# Patient Record
Sex: Female | Born: 1955 | Race: Black or African American | Hispanic: No | Marital: Single | Smoking: Former smoker
Health system: Southern US, Community
[De-identification: ages and names within clinical notes are randomized; demographics above are authoritative.]

## PROBLEM LIST (undated history)

## (undated) DIAGNOSIS — R2681 Unsteadiness on feet: Secondary | ICD-10-CM

## (undated) DIAGNOSIS — I10 Essential (primary) hypertension: Secondary | ICD-10-CM

## (undated) DIAGNOSIS — Z720 Tobacco use: Secondary | ICD-10-CM

## (undated) DIAGNOSIS — I509 Heart failure, unspecified: Secondary | ICD-10-CM

## (undated) DIAGNOSIS — Z21 Asymptomatic human immunodeficiency virus [HIV] infection status: Secondary | ICD-10-CM

## (undated) DIAGNOSIS — D849 Immunodeficiency, unspecified: Secondary | ICD-10-CM

## (undated) DIAGNOSIS — B20 Human immunodeficiency virus [HIV] disease: Secondary | ICD-10-CM

## (undated) DIAGNOSIS — F259 Schizoaffective disorder, unspecified: Secondary | ICD-10-CM

## (undated) DIAGNOSIS — F329 Major depressive disorder, single episode, unspecified: Secondary | ICD-10-CM

## (undated) DIAGNOSIS — E119 Type 2 diabetes mellitus without complications: Secondary | ICD-10-CM

## (undated) DIAGNOSIS — F32A Depression, unspecified: Secondary | ICD-10-CM

## (undated) DIAGNOSIS — G629 Polyneuropathy, unspecified: Secondary | ICD-10-CM

## (undated) HISTORY — DX: Heart failure, unspecified: I50.9

## (undated) HISTORY — PX: OTHER SURGICAL HISTORY: SHX169

## (undated) HISTORY — PX: HERNIA REPAIR: SHX51

---

## 2016-07-13 ENCOUNTER — Encounter (HOSPITAL_COMMUNITY): Payer: Self-pay | Admitting: Emergency Medicine

## 2016-07-13 ENCOUNTER — Ambulatory Visit (HOSPITAL_COMMUNITY)
Admission: RE | Admit: 2016-07-13 | Discharge: 2016-07-13 | Disposition: A | Discharge status: Home / Self Care | Payer: MEDICAID | Attending: Psychiatry | Admitting: Psychiatry

## 2016-07-13 ENCOUNTER — Emergency Department (HOSPITAL_COMMUNITY)
Admission: EM | Admit: 2016-07-13 | Discharge: 2016-07-31 | Disposition: A | Discharge status: Home / Self Care | Payer: MEDICAID | Attending: Emergency Medicine | Admitting: Emergency Medicine

## 2016-07-13 DIAGNOSIS — F985 Adult onset fluency disorder: Secondary | ICD-10-CM | POA: Diagnosis not present

## 2016-07-13 DIAGNOSIS — R52 Pain, unspecified: Secondary | ICD-10-CM

## 2016-07-13 DIAGNOSIS — Z79899 Other long term (current) drug therapy: Secondary | ICD-10-CM | POA: Diagnosis not present

## 2016-07-13 DIAGNOSIS — Z21 Asymptomatic human immunodeficiency virus [HIV] infection status: Secondary | ICD-10-CM | POA: Insufficient documentation

## 2016-07-13 DIAGNOSIS — F4324 Adjustment disorder with disturbance of conduct: Secondary | ICD-10-CM

## 2016-07-13 DIAGNOSIS — R0602 Shortness of breath: Secondary | ICD-10-CM

## 2016-07-13 DIAGNOSIS — R479 Unspecified speech disturbances: Secondary | ICD-10-CM | POA: Diagnosis present

## 2016-07-13 DIAGNOSIS — F8081 Childhood onset fluency disorder: Secondary | ICD-10-CM

## 2016-07-13 HISTORY — DX: Asymptomatic human immunodeficiency virus (hiv) infection status: Z21

## 2016-07-13 HISTORY — DX: Human immunodeficiency virus (HIV) disease: B20

## 2016-07-13 HISTORY — DX: Essential (primary) hypertension: I10

## 2016-07-13 HISTORY — DX: Immunodeficiency, unspecified: D84.9

## 2016-07-13 HISTORY — DX: Schizoaffective disorder, unspecified: F25.9

## 2016-07-13 HISTORY — DX: Tobacco use: Z72.0

## 2016-07-13 HISTORY — DX: Depression, unspecified: F32.A

## 2016-07-13 HISTORY — DX: Major depressive disorder, single episode, unspecified: F32.9

## 2016-07-13 LAB — COMPREHENSIVE METABOLIC PANEL
ALT: 57 U/L — ABNORMAL HIGH (ref 14–54)
AST: 42 U/L — ABNORMAL HIGH (ref 15–41)
Albumin: 3.9 g/dL (ref 3.5–5.0)
Alkaline Phosphatase: 102 U/L (ref 38–126)
Anion gap: 11 (ref 5–15)
BUN: 37 mg/dL — ABNORMAL HIGH (ref 6–20)
CO2: 21 mmol/L — ABNORMAL LOW (ref 22–32)
Calcium: 9.3 mg/dL (ref 8.9–10.3)
Chloride: 105 mmol/L (ref 101–111)
Creatinine, Ser: 1.94 mg/dL — ABNORMAL HIGH (ref 0.44–1.00)
GFR calc Af Amer: 32 mL/min — ABNORMAL LOW (ref >60)
GFR calc non Af Amer: 28 mL/min — ABNORMAL LOW (ref >60)
Glucose, Bld: 102 mg/dL — ABNORMAL HIGH (ref 65–99)
Potassium: 3.1 mmol/L — ABNORMAL LOW (ref 3.5–5.1)
Sodium: 137 mmol/L (ref 135–145)
Total Bilirubin: 1.4 mg/dL — ABNORMAL HIGH (ref 0.3–1.2)
Total Protein: 8.6 g/dL — ABNORMAL HIGH (ref 6.5–8.1)

## 2016-07-13 LAB — CBC
HCT: 31.1 % — ABNORMAL LOW (ref 36.0–46.0)
Hemoglobin: 10.3 g/dL — ABNORMAL LOW (ref 12.0–15.0)
MCH: 31.3 pg (ref 26.0–34.0)
MCHC: 33.1 g/dL (ref 30.0–36.0)
MCV: 94.5 fL (ref 78.0–100.0)
Platelets: 204 10*3/uL (ref 150–400)
RBC: 3.29 MIL/uL — ABNORMAL LOW (ref 3.87–5.11)
RDW: 17.7 % — ABNORMAL HIGH (ref 11.5–15.5)
WBC: 6.1 10*3/uL (ref 4.0–10.5)

## 2016-07-13 LAB — ETHANOL: Alcohol, Ethyl (B): <5 mg/dL (ref <5)

## 2016-07-13 LAB — ACETAMINOPHEN LEVEL: Acetaminophen (Tylenol), Serum: <10 ug/mL — ABNORMAL LOW (ref 10–30)

## 2016-07-13 LAB — SALICYLATE LEVEL: Salicylate Lvl: <4 mg/dL (ref 2.8–30.0)

## 2016-07-13 MED ORDER — POTASSIUM CHLORIDE CRYS ER 20 MEQ PO TBCR
40.0000 meq | EXTENDED_RELEASE_TABLET | Freq: Once | ORAL | Status: AC
  Administered 2016-07-13: 40 meq via ORAL
  Filled 2016-07-13 (×2): qty 2

## 2016-07-13 MED ORDER — TRAZODONE HCL 50 MG PO TABS
50.0000 mg | ORAL_TABLET | Freq: Every day | ORAL | Status: DC
  Administered 2016-07-13 – 2016-07-20 (×7): 50 mg via ORAL
  Filled 2016-07-13 (×7): qty 1

## 2016-07-13 MED ORDER — HYDROXYZINE HCL 25 MG PO TABS
25.0000 mg | ORAL_TABLET | Freq: Four times a day (QID) | ORAL | Status: DC | PRN
  Administered 2016-07-13 – 2016-07-28 (×16): 25 mg via ORAL
  Filled 2016-07-13 (×18): qty 1

## 2016-07-13 NOTE — BH Assessment (Signed)
Patient has a Agricultural consultant with [facility] [phone].

## 2016-07-13 NOTE — Progress Notes (Signed)
CSW was consulted due to concerns that pt may not be able to return to her group home.    CSW met with patient at bedside after having to ask pt to come back inside due to her leaving to go sit outside and try and find a cigarette.  Patient was extremely difficult to understand due to speech. Patient states that she is not aware of any charges taken against her. Patient states that she feels she is not getting her check. CSW informed her that when living at any type of facility, that it usually requires the check to go toward room and board expenses. After meeting with patient, she asked could she go back outside. CSW informed her that she is not supposed to walk outside because she still receiving care. Patient expressed understanding.   CSW reached out to [place] leader to inquire if any charges or an eviction notice has been taken out on patient. If not, CSW will inform group home that [place] is not an appropriate setting for patient to stay.  CSW reached out to group home x2. However, there was no answer. CSW will continue to follow up.  [place], [place] ED CSW 7/24/20175:28 PM
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
## 2016-07-13 NOTE — ED Notes (Signed)
Patient came out to desk stating that someone was on the phone.  This Rn went into triage room and spoke with Judson Roch from [facility] in [place] office.  She stated that patient was recently released from [facility] on July 17 for PNA to a group home in [place], so patient ACT team was transferred to [facility] office, Lars Masson is team lead and who we can speak to.  Judson Roch also added that they are looking for a new group home for her because the group home in [place] is not taking her back.  Patient also has brain tumor and they believe that is effecting her speech.

## 2016-07-13 NOTE — ED Notes (Signed)
Bed: WTR9 Expected date:  Expected time:  Means of arrival:  Comments: Pt still in room

## 2016-07-13 NOTE — ED Notes (Signed)
Pt refused lab work.  RN aware.  

## 2016-07-13 NOTE — ED Provider Notes (Signed)
[facility] DEPT Provider Note   CSN: [id number] Arrival date & time: 07/13/16  1147  First Provider Contact:  None    By signing my name below, I, Emily Porter, attest that this documentation has been prepared under the direction and in the presence of Virgel Manifold, MD. Electronically Signed: Rayna Porter, ED Scribe. 07/13/16. 3:00 PM.   History   Chief Complaint Chief Complaint  Patient presents with  . Medical Clearance    HPI HPI Comments: Emily Porter is a 60 y.o. female who presents to the Emergency Department for medical clearance. Per nursing note, pt was brought over from [facility] and was told she was "not a candidate" and has no listed medications or medical history. Pt is having difficulty speaking and states that she began taking a new medication which is causing her to stutter. She states she was in a hospital in [place] last week and her medications were changed prior to the onset of her current symptoms. She reports an associated, mild, diffuse, HA and auditory hallucinations and her explanation of the hallucinations is difficult to understand. Pt reports currently being homeless. She denies SI/HI  The history is provided by the patient and medical records. No language interpreter was used.    History reviewed. No pertinent past medical history.  There are no active problems to display for this patient.   Past Surgical History:  Procedure Laterality Date  . HERNIA REPAIR      OB History    No data available       Home Medications    Prior to Admission medications   Not on File    Family History No family history on file.  Social History Social History  Substance Use Topics  . Smoking status: Never Smoker  . Smokeless tobacco: Not on file  . Alcohol use No     Allergies   Penicillins   Review of Systems Review of Systems  Neurological: Positive for headaches.  Psychiatric/Behavioral: Positive for confusion, decreased concentration  and hallucinations. Negative for self-injury and suicidal ideas.  All other systems reviewed and are negative.  Physical Exam Updated Vital Signs BP 113/71 (BP Location: Right Arm)   Pulse 65   Temp 98.3 F (36.8 C) (Oral)   Resp 20   SpO2 99%    Physical Exam  Constitutional: She is oriented to person, place, and time. She appears well-developed.  HENT:  Head: Normocephalic.  Eyes: EOM are normal.  Neck: Normal range of motion.  Pulmonary/Chest: Effort normal.  Abdominal: She exhibits no distension.  Musculoskeletal: Normal range of motion.  Neurological: She is alert and oriented to person, place, and time.  Psychiatric:  Rambling and stuttering speech; very difficult to follow at times; thoughts seemed tangential   Nursing note and vitals reviewed.    ED Treatments / Results  Labs (all labs ordered are listed, but only abnormal results are displayed) Labs Reviewed  COMPREHENSIVE METABOLIC PANEL - Abnormal; Notable for the following:       Result Value   Potassium 3.1 (*)    CO2 21 (*)    Glucose, Bld 102 (*)    BUN 37 (*)    Creatinine, Ser 1.94 (*)    Total Protein 8.6 (*)    AST 42 (*)    ALT 57 (*)    Total Bilirubin 1.4 (*)    GFR calc non Af Amer 28 (*)    GFR calc Af Amer 32 (*)    All other components within  normal limits  ACETAMINOPHEN LEVEL - Abnormal; Notable for the following:    Acetaminophen (Tylenol), Serum <10 (*)    All other components within normal limits  CBC - Abnormal; Notable for the following:    RBC 3.29 (*)    Hemoglobin 10.3 (*)    HCT 31.1 (*)    RDW 17.7 (*)    All other components within normal limits  ETHANOL  SALICYLATE LEVEL  URINE RAPID DRUG SCREEN, HOSP PERFORMED    EKG  EKG Interpretation None       Radiology No results found.  Procedures Procedures  DIAGNOSTIC STUDIES: Oxygen Saturation is 99% on RA, normal by my interpretation.    COORDINATION OF CARE: 2:44 PM Discussed next steps with pt. Pt  verbalized understanding and is agreeable with the plan.    Medications Ordered in ED Medications - No data to display   Initial Impression / Assessment and Plan / ED Course  I have reviewed the triage vital signs and the nursing notes.  Pertinent labs & imaging results that were available during my care of the patient were reviewed by me and considered in my medical decision making (see chart for details).  Clinical Course    59yF with stuttering speech. This is not expressive aphasia. She seems to think this may be a medication side effect. It may be. She is also reporting hearing voices, but it is generally very difficult to get a coherent history. Will medically clear for psych evaluation.  I personally preformed the services scribed in my presence. The recorded information has been reviewed is accurate. Virgel Manifold, MD.   Final Clinical Impressions(s) / ED Diagnoses   Final diagnoses:  Stammering and stuttering    New Prescriptions New Prescriptions   No medications on file     Virgel Manifold, MD 07/13/16 1541

## 2016-07-13 NOTE — ED Notes (Signed)
Patient reports that she was seen by the doctor and he told her that she needed to call a ride and she could leave. Patient states that ride is outside and she is leaving. Patient out in in hallway with blanket stating that she is now leaving. Patient walked out of double doors.
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
## 2016-07-13 NOTE — ED Notes (Signed)
Pt to go to room 31

## 2016-07-13 NOTE — ED Triage Notes (Signed)
Patient brought over from [facility] after they stated that patient "was not a candidate for [facility]". Patient is from [place], [place] with no history or medication. Patient unable to verbalize why she is here. Patient having difficulty talking and expressing thoughts. Did state that her current meds are making her talk "weird". Does not know what meds she is taking.
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
## 2016-07-13 NOTE — Progress Notes (Addendum)
Medicaid [place] access response hx indicates the assigned pcp is [facility] [place], [place] [address] [phone]  Entered in d/c instructions [facility]     Medicaid [place] access response hx indicates the assigned pcp is [facility] [place], [place] [address] [phone]    Next Steps: Schedule an appointment as soon as possible for a visit today    Instructions: This is your assigned Medicaid [place] access doctor If you prefer to see another Medicaid doctor in [facility] other than the one on your Medicaid card [place] [phone] or [phone]

## 2016-07-14 DIAGNOSIS — F4324 Adjustment disorder with disturbance of conduct: Secondary | ICD-10-CM

## 2016-07-14 MED ORDER — AMLODIPINE BESYLATE 10 MG PO TABS
10.0000 mg | ORAL_TABLET | Freq: Every day | ORAL | Status: DC
  Administered 2016-07-14 – 2016-07-21 (×7): 10 mg via ORAL
  Filled 2016-07-14 (×8): qty 1

## 2016-07-14 MED ORDER — SALINE SPRAY 0.65 % NA SOLN
1.0000 | NASAL | Status: DC | PRN
  Administered 2016-07-17 – 2016-07-26 (×7): 1 via NASAL
  Filled 2016-07-14 (×2): qty 44

## 2016-07-14 NOTE — Progress Notes (Signed)
CSW met with patient at bedside per her request. She asked CSW if she has heard from her old group home regarding her check. CS informed her that she has no information regarding her check at this time. CSW also informed pt that she is working to try and find her placement.  Willette Brace [phone] ED CSW 7/25/20175:45 PM
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
## 2016-07-14 NOTE — Consult Note (Signed)
[facility] Face-to-Face Psychiatry Consult   Reason for Consult:  Brought by group home.  Alleging that patient patient was having unprotected sex and patient has HIV  Referring Physician:  EDP Patient Identification: Emily Porter MRN:  [id number] Principal Diagnosis: Adjustment disorder with disturbance of conduct Diagnosis:   Patient Active Problem List   Diagnosis Date Noted  . Adjustment disorder with disturbance of conduct [F43.24] 07/14/2016    Total Time spent with patient: 30 minutes  Subjective:   Emily Porter is a 60 y.o. female patient admitted to [facility] after being brought in by the group home she was residing at.    HPI:  It was in the chart records, that patient was alleged to be engaging in unprotected sex.  Patient has HIV.  It was reported that the facility will not allow her back.  Patient has ACTT Team with [facility] and has a Glass blower/designer.    Today, patient is seen.   She is pleasant.  She does have speech impediment noted, she stutters.  She was a poor historian, noting that she lived with her "sister" then at the "group home", then in "[place]."  She is denying suicidal and homicidal ideations.  Patient is denying psychosis.  She would like to go back to her group home.    Past Psychiatric History:  See HPI  Risk to Self: Suicidal Ideation: No Suicidal Intent: No Is patient at risk for suicide?: No Suicidal Plan?: No Access to Means: No What has been your use of drugs/alcohol within the last 12 months?: na How many times?: 0 Other Self Harm Risks: None Reported Triggers for Past Attempts: Unpredictable Intentional Self Injurious Behavior: None Risk to Others: Homicidal Ideation: No Thoughts of Harm to Others: No Current Homicidal Intent: No Current Homicidal Plan: No Access to Homicidal Means: No Identified Victim: NA History of harm to others?: No Assessment of Violence: None Noted Violent Behavior Description: NA Does patient have access to  weapons?: No Criminal Charges Pending?: No Does patient have a court date: No Prior Inpatient Therapy: Prior Inpatient Therapy: Yes Prior Therapy Dates: 2017 Prior Therapy Facilty/Provider(s): [facility] Reason for Treatment: Manic Prior Outpatient Therapy: Prior Outpatient Therapy: Yes Prior Therapy Dates: Ongoing Prior Therapy Facilty/Provider(s): [facility] Reason for Treatment: ACTT Services Does patient have an ACCT team?: Yes Does patient have Intensive In-House Services?  : No Does patient have [facility] services? : No Does patient have P4CC services?: No  Past Medical History: History reviewed. No pertinent past medical history.  Past Surgical History:  Procedure Laterality Date  . HERNIA REPAIR     Family History: No family history on file. Family Psychiatric  History:  See HPI Social History:  History  Alcohol Use No     History  Drug use: Unknown    Social History   Social History  . Marital status: Single    Spouse name: N/A  . Number of children: N/A  . Years of education: N/A   Social History Main Topics  . Smoking status: Never Smoker  . Smokeless tobacco: None  . Alcohol use No  . Drug use: Unknown  . Sexual activity: Not Asked   Other Topics Concern  . None   Social History Narrative  . None   Additional Social History:    Allergies:   Allergies  Allergen Reactions  . Penicillins Hives    Has patient had a PCN reaction causing immediate rash, facial/tongue/throat swelling, SOB or lightheadedness with hypotension: unknown Has  patient had a PCN reaction causing severe rash involving mucus membranes or skin necrosis: unknown Has patient had a PCN reaction that required hospitalization : unknown Has patient had a PCN reaction occurring within the last 10 years: no  If all of the above answers are "NO", then may proceed with Cephalosporin use.     Labs:  Results for orders placed or performed during the hospital encounter of  07/13/16 (from the past 48 hour(s))  Comprehensive metabolic panel     Status: Abnormal   Collection Time: 07/13/16  1:24 PM  Result Value Ref Range   Sodium 137 135 - 145 mmol/L   Potassium 3.1 (L) 3.5 - 5.1 mmol/L   Chloride 105 101 - 111 mmol/L   CO2 21 (L) 22 - 32 mmol/L   Glucose, Bld 102 (H) 65 - 99 mg/dL   BUN 37 (H) 6 - 20 mg/dL   Creatinine, Ser 1.94 (H) 0.44 - 1.00 mg/dL   Calcium 9.3 8.9 - 10.3 mg/dL   Total Protein 8.6 (H) 6.5 - 8.1 g/dL   Albumin 3.9 3.5 - 5.0 g/dL   AST 42 (H) 15 - 41 U/L   ALT 57 (H) 14 - 54 U/L   Alkaline Phosphatase 102 38 - 126 U/L   Total Bilirubin 1.4 (H) 0.3 - 1.2 mg/dL   GFR calc non Af Amer 28 (L) >60 mL/min   GFR calc Af Amer 32 (L) >60 mL/min    Comment: (NOTE) The eGFR has been calculated using the CKD EPI equation. This calculation has not been validated in all clinical situations. eGFR's persistently <60 mL/min signify possible Chronic Kidney Disease.    Anion gap 11 5 - 15  Ethanol     Status: None   Collection Time: 07/13/16  1:24 PM  Result Value Ref Range   Alcohol, Ethyl (B) <5 <5 mg/dL    Comment:        LOWEST DETECTABLE LIMIT FOR SERUM ALCOHOL IS 5 mg/dL FOR MEDICAL PURPOSES ONLY   Salicylate level     Status: None   Collection Time: 07/13/16  1:24 PM  Result Value Ref Range   Salicylate Lvl <0.2 2.8 - 30.0 mg/dL  Acetaminophen level     Status: Abnormal   Collection Time: 07/13/16  1:24 PM  Result Value Ref Range   Acetaminophen (Tylenol), Serum <10 (L) 10 - 30 ug/mL    Comment:        THERAPEUTIC CONCENTRATIONS VARY SIGNIFICANTLY. A RANGE OF 10-30 ug/mL MAY BE AN EFFECTIVE CONCENTRATION FOR MANY PATIENTS. HOWEVER, SOME ARE BEST TREATED AT CONCENTRATIONS OUTSIDE THIS RANGE. ACETAMINOPHEN CONCENTRATIONS >150 ug/mL AT 4 HOURS AFTER INGESTION AND >50 ug/mL AT 12 HOURS AFTER INGESTION ARE OFTEN ASSOCIATED WITH TOXIC REACTIONS.   cbc     Status: Abnormal   Collection Time: 07/13/16  1:24 PM  Result Value Ref  Range   WBC 6.1 4.0 - 10.5 K/uL   RBC 3.29 (L) 3.87 - 5.11 MIL/uL   Hemoglobin 10.3 (L) 12.0 - 15.0 g/dL   HCT 31.1 (L) 36.0 - 46.0 %   MCV 94.5 78.0 - 100.0 fL   MCH 31.3 26.0 - 34.0 pg   MCHC 33.1 30.0 - 36.0 g/dL   RDW 17.7 (H) 11.5 - 15.5 %   Platelets 204 150 - 400 K/uL    Current Facility-Administered Medications  Medication Dose Route Frequency Provider Last Rate Last Dose  . hydrOXYzine (ATARAX/VISTARIL) tablet 25 mg  25 mg Oral Q6H PRN Maurine Minister  Simon, PA-C   25 mg at 07/13/16 2346  . sodium chloride (OCEAN) 0.65 % nasal spray 1 spray  1 spray Each Nare PRN Harvel Quale, MD      . traZODone (DESYREL) tablet 50 mg  50 mg Oral QHS Laverle Hobby, PA-C   50 mg at 07/13/16 2346   No current outpatient prescriptions on file.    Musculoskeletal: Strength & Muscle Tone: within normal limits Gait & Station: normal Patient leans: N/A  Psychiatric Specialty Exam: Physical Exam  ROS  Blood pressure 132/72, pulse 82, temperature 98.8 F (37.1 C), temperature source Oral, resp. rate 20, SpO2 98 %.There is no height or weight on file to calculate BMI.  General Appearance: Disheveled  Eye Contact:  Fair  Speech:  stuttered speech  Volume:  Normal  Mood:  Anxious  Affect:  Congruent  Thought Process:  Coherent  Orientation:  Full (Time, Place, and Person)  Thought Content:  Rumination  Suicidal Thoughts:  No  Homicidal Thoughts:  No  Memory:  Immediate;   Good Recent;   Good Remote;   Good  Judgement:  Fair  Insight:  Fair  Psychomotor Activity:  Normal  Concentration:  Concentration: Fair and Attention Span: Fair  Recall:  [employer] of Knowledge:  Fair  Language:  Fair  Akathisia:  Negative  Handed:  Right  AIMS (if indicated):     Assets:  Resilience  ADL's:  Intact  Cognition:  WNL  Sleep:  poor   Treatment Plan Summary: Daily contact with patient to assess and evaluate symptoms and progress in treatment, Medication management and Plan Discharge back to  group Home  Disposition: No evidence of imminent risk to self or others at present.   Patient does not meet criteria for psychiatric inpatient admission. Supportive therapy provided about ongoing stressors. Discussed crisis plan, support from social network, calling 911, coming to the Emergency Department, and calling Suicide Hotline.  Janett Labella, NP [facility] 07/14/2016 12:08 PM   Patient seen face-to-face for psychiatric evaluation, chart reviewed and case discussed with the physician extender and developed treatment plan. Reviewed the information documented and agree with the treatment plan. Corena Pilgrim, MD

## 2016-07-14 NOTE — Progress Notes (Signed)
CSW received call from [facility] stating that she has an open bed.  She states that she will be able to come to assesst he patient to determine if she is a good fit for her facility. She states that she will be to [facility] tomorrow around 7:00pm.  Willette Brace [phone] ED CSW 7/25/20175:38 PM

## 2016-07-15 MED ORDER — VITAMIN B-6 50 MG PO TABS
50.0000 mg | ORAL_TABLET | Freq: Every day | ORAL | Status: DC
  Administered 2016-07-15 – 2016-07-21 (×7): 50 mg via ORAL
  Filled 2016-07-15 (×7): qty 1

## 2016-07-15 MED ORDER — HALOPERIDOL 5 MG PO TABS
5.0000 mg | ORAL_TABLET | Freq: Every day | ORAL | Status: DC
  Administered 2016-07-15 – 2016-07-21 (×7): 5 mg via ORAL
  Filled 2016-07-15 (×7): qty 1

## 2016-07-15 MED ORDER — LISINOPRIL 40 MG PO TABS
40.0000 mg | ORAL_TABLET | Freq: Every day | ORAL | Status: DC
  Administered 2016-07-15 – 2016-07-21 (×6): 40 mg via ORAL
  Filled 2016-07-15 (×8): qty 1

## 2016-07-15 MED ORDER — SENNA 8.6 MG PO TABS
1.0000 | ORAL_TABLET | Freq: Two times a day (BID) | ORAL | Status: DC
  Administered 2016-07-15 – 2016-07-21 (×12): 8.6 mg via ORAL
  Filled 2016-07-15 (×12): qty 1

## 2016-07-15 MED ORDER — NICOTINE 14 MG/24HR TD PT24
14.0000 mg | MEDICATED_PATCH | Freq: Every day | TRANSDERMAL | Status: DC
  Administered 2016-07-17 – 2016-07-30 (×7): 14 mg via TRANSDERMAL
  Filled 2016-07-15 (×11): qty 1

## 2016-07-15 MED ORDER — ATAZANAVIR SULFATE 200 MG PO CAPS
400.0000 mg | ORAL_CAPSULE | Freq: Every day | ORAL | Status: DC
  Administered 2016-07-16 – 2016-07-20 (×5): 400 mg via ORAL
  Filled 2016-07-15 (×8): qty 2

## 2016-07-15 MED ORDER — ISONIAZID 300 MG PO TABS
300.0000 mg | ORAL_TABLET | Freq: Every day | ORAL | Status: DC
  Administered 2016-07-15 – 2016-07-21 (×7): 300 mg via ORAL
  Filled 2016-07-15 (×7): qty 1

## 2016-07-15 MED ORDER — BUPROPION HCL ER (SR) 150 MG PO TB12
150.0000 mg | ORAL_TABLET | Freq: Two times a day (BID) | ORAL | Status: DC
  Administered 2016-07-15 – 2016-07-21 (×12): 150 mg via ORAL
  Filled 2016-07-15 (×13): qty 1

## 2016-07-15 MED ORDER — ABACAVIR SULFATE 300 MG PO TABS
600.0000 mg | ORAL_TABLET | Freq: Every day | ORAL | Status: DC
  Administered 2016-07-15 – 2016-07-21 (×7): 600 mg via ORAL
  Filled 2016-07-15 (×7): qty 2

## 2016-07-15 MED ORDER — CHOLECALCIFEROL 10 MCG (400 UNIT) PO TABS
800.0000 [IU] | ORAL_TABLET | Freq: Every day | ORAL | Status: DC
Start: 2016-07-15 — End: 2016-07-21
  Administered 2016-07-15 – 2016-07-21 (×7): 800 [IU] via ORAL
  Filled 2016-07-15 (×7): qty 2

## 2016-07-15 MED ORDER — HYDROCHLOROTHIAZIDE 25 MG PO TABS
25.0000 mg | ORAL_TABLET | Freq: Every day | ORAL | Status: DC
Start: 2016-07-15 — End: 2016-07-21
  Administered 2016-07-15 – 2016-07-21 (×6): 25 mg via ORAL
  Filled 2016-07-15 (×7): qty 1

## 2016-07-15 MED ORDER — ALBUTEROL SULFATE HFA 108 (90 BASE) MCG/ACT IN AERS
2.0000 | INHALATION_SPRAY | Freq: Four times a day (QID) | RESPIRATORY_TRACT | Status: DC | PRN
  Administered 2016-07-15 – 2016-07-30 (×15): 2 via RESPIRATORY_TRACT
  Filled 2016-07-15 (×8): qty 6.7

## 2016-07-15 MED ORDER — GABAPENTIN 100 MG PO CAPS
100.0000 mg | ORAL_CAPSULE | Freq: Three times a day (TID) | ORAL | Status: DC
  Administered 2016-07-15 – 2016-07-21 (×17): 100 mg via ORAL
  Filled 2016-07-15 (×17): qty 1

## 2016-07-15 MED ORDER — AMANTADINE HCL 100 MG PO CAPS
100.0000 mg | ORAL_CAPSULE | Freq: Two times a day (BID) | ORAL | Status: DC
  Administered 2016-07-15 – 2016-07-21 (×12): 100 mg via ORAL
  Filled 2016-07-15 (×12): qty 1

## 2016-07-15 MED ORDER — NICOTINE POLACRILEX 2 MG MT GUM
4.0000 mg | CHEWING_GUM | OROMUCOSAL | Status: DC | PRN
  Administered 2016-07-24 – 2016-07-30 (×3): 4 mg via ORAL
  Filled 2016-07-15 (×3): qty 2

## 2016-07-15 MED ORDER — LAMIVUDINE 150 MG PO TABS
300.0000 mg | ORAL_TABLET | Freq: Every day | ORAL | Status: DC
  Administered 2016-07-15 – 2016-07-16 (×2): 300 mg via ORAL
  Filled 2016-07-15 (×2): qty 2

## 2016-07-15 NOTE — Progress Notes (Addendum)
CSW spoke with Colletta Maryland with the [facility]. She stated their Housing Specialist is looking for beds for placement and that [employer] has some vacancies and they were informed to call back in the morning.   CSW spoke with Cleo at [facility] who states she spoke with Spero Geralds. She stated Mrs. Torain stated stated she was informed she had to take charges against patient. CSW informed her that she was asked if she had taken charges or if she had completed an eviction notice. She stated they have only had patient since July 17th and she is new to their agency.   CSW spoke patient who asked if CSW could place her in an apartment. Patient was informed she would not be placed in an apartment but that placement was being worked on at the time.    Colletta Maryland, [facility] [phone]

## 2016-07-15 NOTE — Progress Notes (Signed)
CSW continues to search for placement for patient. CSW reached out to multiple facilities, the following answered their phone are as listed:  [facility] : No open [place]: No open [place]: No open [place]: No open [facility]  [facility]: No open  Beds  [facility]:   CSW spoke with staff member Ivin Booty, who states that there may be an opening. She states that she will have the owner/operator Elwanda Brooklyn reach back out to CSW I order to possibly schedule an assessment for tomorrow.  CSW will continue to follow up.  Willette Brace [id number] ED CSW 7/26/20178:34 PM

## 2016-07-15 NOTE — Progress Notes (Addendum)
Staffed case with Asst. Social Work Mudlogger.   Called and spoke with Spero Geralds, Owner "[facility]". She stated on yesterday she was going to take charges against the patient. She stated she had the paperwork for the charges. CSW asked if she could fax the information and she stated she would fax. CSW provided fax number to send information [phone]. She stated she would send the information this evening.  Spoke with Berenice Bouton with [facility]. She stated information could be faxed to [phone] for review to see if patient would be appropriate for the home.   Call received from Berenice Bouton who states she received the information and she will review it with her supervisor.   Genice Rouge [id number] ED CSW 07/15/2016 11:12 AM

## 2016-07-15 NOTE — ED Notes (Signed)
Patient just finished showering.

## 2016-07-15 NOTE — ED Notes (Signed)
Patient is impulsive, intrusive, and excessively loud.  Patient is cooperative does respond to redirection.

## 2016-07-15 NOTE — Progress Notes (Signed)
07/15/16  1700  Notified MD that patient home medicines have not be started. Per MD he will look over medicines.

## 2016-07-15 NOTE — ED Notes (Signed)
Patient refused Vital signs. 

## 2016-07-15 NOTE — ED Provider Notes (Signed)
Reevaluation- since arrival, the patient has been evaluated, by psychiatry who felt that she did not have any acute psychiatric abnormalities. Additionally, information from her prior medical providers has been obtained including a medication list. As of now. She is not yet been started on her home medicines other than Norvasc, which was apparently yesterday. I will order her routine medications now. I will consult case management to see if they are proceeding with placement.   Daleen Bo, MD 07/15/16 1721

## 2016-07-15 NOTE — Progress Notes (Signed)
07/15/16  1856  Called [facility] for an albuterol treatment per patient request.  Per [facility] they will be here shortly. Pt has order for albuterol inhaler.

## 2016-07-16 MED ORDER — LAMIVUDINE 150 MG PO TABS
150.0000 mg | ORAL_TABLET | Freq: Every day | ORAL | Status: DC
  Administered 2016-07-17 – 2016-07-20 (×4): 150 mg via ORAL
  Filled 2016-07-16 (×4): qty 1

## 2016-07-16 NOTE — ED Notes (Signed)
Patient had incontinent episode.  New linen placed on bed.  Patient refused to shower.

## 2016-07-16 NOTE — Progress Notes (Addendum)
Patient's case staffed in LOS this AM. CSW contacted [place], [place], [place] and spoke with [facility], Admissions [phone]. She stated they no longer have AFL but they are now only long tern care and rehab. Director of Social Work updated.   CSW attempted a call to [employer] at [facility]. She was not in at the time. CSW will follow-up. CSW attempted another call to [place] and was informed she was not in at the time.   CSW received approval from Director of Social Work to send patient's information to ALF for placement. Patient's information was sent to ALF to assist with placement, two declines no acceptances at this time 3:44pm.  Genice Rouge [id number] ED CSW 07/16/2016 12:26 PM

## 2016-07-16 NOTE — ED Triage Notes (Signed)
Pt talking to someone/something in room

## 2016-07-16 NOTE — NC FL2 (Cosign Needed)
[place] MEDICAID FL2 LEVEL OF CARE SCREENING TOOL     IDENTIFICATION  Patient Name: Emily Porter Birthdate: 06-Dec-1956 Sex: female Admission Date (Current Location): 07/13/2016  [facility] and [place] Number:  Herbalist and Address:  [facility],  [place] [address], [place]      Provider Number: [id number]  Attending Physician Name and Address:  No att. providers found  Relative Name and Phone Number:       Current Level of Care: Hospital Recommended Level of Care: [place] Prior Approval Number:    Date Approved/Denied:   PASRR Number:    Discharge Plan: Other (Comment)    Current Diagnoses: Patient Active Problem List   Diagnosis Date Noted  . 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    Orientation RESPIRATION BLADDER Height & Weight     Self, Situation, Place  Normal Incontinent Weight: 205 lb (93 kg) (Patient refused to stand on the scale for weight) Height:  5\' 9"  (175.3 cm)  BEHAVIORAL SYMPTOMS/MOOD NEUROLOGICAL BOWEL NUTRITION STATUS      Continent Diet  AMBULATORY STATUS COMMUNICATION OF NEEDS Skin   Independent Verbally Normal                       Personal Care Assistance Level of Assistance  Bathing, Feeding, Dressing Bathing: Per Nurse, needs assistance, cues and commands, does not follow through with what she states she will do Feeding assistance: Independent Dressing: Per Nurse, patient will needs cues and commands to assist with dressing     Functional Limitations Info  Sight, Hearing, Speech Sight Info: Adequate Hearing Info: Adequate Speech Info:  (Patient stutters, have to listen carefully per Nurse)    [place]  Blood pressure Blood Pressure Frequency: Per Nurse, patient on blood pressure medication                  Contractures      Additional Factors Info  Code Status, Allergies Code Status Info:   (Full Code) Allergies Info:  (Penicillins)           Current Medications (07/16/2016):  This is the current hospital active medication list Current Facility-Administered Medications  Medication Dose Route Frequency Provider Last Rate Last Dose  . abacavir (ZIAGEN) tablet 600 mg  600 mg Oral Daily Daleen Bo, MD   600 mg at 07/16/16 1050  . albuterol (PROVENTIL HFA;VENTOLIN HFA) 108 (90 Base) MCG/ACT inhaler 2 puff  2 puff Inhalation Q6H PRN Daleen Bo, MD   2 puff at 07/15/16 1910  . amantadine (SYMMETREL) capsule 100 mg  100 mg Oral BID Daleen Bo, MD   100 mg at 07/16/16 1051  . amLODipine (NORVASC) tablet 10 mg  10 mg Oral Daily Harvel Quale, MD   10 mg at 07/16/16 1049  . atazanavir (REYATAZ) capsule 400 mg  400 mg Oral Q breakfast Daleen Bo, MD      . buPROPion [facility] SR) 12 hr tablet 150 mg  150 mg Oral BID Daleen Bo, MD   150 mg at 07/16/16 1050  . cholecalciferol (VITAMIN D) tablet 800 Units  800 Units Oral Daily Daleen Bo, MD   800 Units at 07/16/16 1050  . gabapentin (NEURONTIN) capsule 100 mg  100 mg Oral TID Daleen Bo, MD   100 mg at 07/16/16 1050  . haloperidol (HALDOL) tablet 5 mg  5 mg Oral Daily Daleen Bo, MD  5 mg at 07/16/16 1051  . hydrochlorothiazide (HYDRODIURIL) tablet 25 mg  25 mg Oral Daily Daleen Bo, MD   25 mg at 07/16/16 1051  . hydrOXYzine (ATARAX/VISTARIL) tablet 25 mg  25 mg Oral Q6H PRN Laverle Hobby, PA-C   25 mg at 07/15/16 2000  . isoniazid (NYDRAZID) tablet 300 mg  300 mg Oral Daily Daleen Bo, MD   300 mg at 07/16/16 1050  . lamiVUDine (EPIVIR) tablet 300 mg  300 mg Oral Daily Daleen Bo, MD   300 mg at 07/16/16 1049  . lisinopril (PRINIVIL,ZESTRIL) tablet 40 mg  40 mg Oral Daily Daleen Bo, MD   40 mg at 07/16/16 1049  . nicotine (NICODERM CQ - dosed in mg/24 hours) patch 14 mg  14 mg Transdermal Daily Daleen Bo, MD      . nicotine polacrilex (NICORETTE) gum 4 mg  4 mg Oral Q2H PRN Daleen Bo, MD       . pyridOXINE (VITAMIN B-6) tablet 50 mg  50 mg Oral Daily Daleen Bo, MD   50 mg at 07/16/16 1051  . senna (SENOKOT) tablet 8.6 mg  1 tablet Oral BID Daleen Bo, MD   8.6 mg at 07/16/16 1051  . sodium chloride (OCEAN) 0.65 % nasal spray 1 spray  1 spray Each Nare PRN Harvel Quale, MD      . traZODone (DESYREL) tablet 50 mg  50 mg Oral QHS Laverle Hobby, PA-C   50 mg at 07/15/16 2259   Current Outpatient Prescriptions  Medication Sig Dispense Refill  . abacavir (ZIAGEN) 300 MG tablet Take 600 mg by mouth daily.    Marland Kitchen albuterol (PROVENTIL HFA;VENTOLIN HFA) 108 (90 Base) MCG/ACT inhaler Inhale 2 puffs into the lungs every 6 (six) hours as needed for wheezing or shortness of breath.    Marland Kitchen amantadine (SYMMETREL) 100 MG capsule Take 100 mg by mouth 2 (two) times daily.    Marland Kitchen amLODipine (NORVASC) 10 MG tablet Take 10 mg by mouth daily.    Marland Kitchen atazanavir (REYATAZ) 200 MG capsule Take 400 mg by mouth daily with breakfast.    . buPROPion (WELLBUTRIN SR) 150 MG 12 hr tablet Take 150 mg by mouth 2 (two) times daily.    . cholecalciferol (VITAMIN D) 400 units TABS tablet Take 800 Units by mouth daily.    Marland Kitchen gabapentin (NEURONTIN) 100 MG capsule Take 100 mg by mouth 3 (three) times daily.    . haloperidol (HALDOL) 5 MG tablet Take 5 mg by mouth daily.    . hydrochlorothiazide (HYDRODIURIL) 25 MG tablet Take 25 mg by mouth daily.    Marland Kitchen isoniazid (NYDRAZID) 300 MG tablet Take 300 mg by mouth daily.    Marland Kitchen lamivudine (EPIVIR) 300 MG tablet Take 300 mg by mouth daily.    Marland Kitchen lisinopril (PRINIVIL,ZESTRIL) 40 MG tablet Take 40 mg by mouth daily.    . nicotine (NICODERM CQ - DOSED IN MG/24 HOURS) 14 mg/24hr patch Place 14 mg onto the skin daily.    . nicotine polacrilex (NICORETTE) 4 MG gum Take 4 mg by mouth every 2 (two) hours as needed for smoking cessation.    . senna (SENOKOT) 8.6 MG TABS tablet Take 1 tablet by mouth 2 (two) times daily.    . Thiamine HCl (VITAMIN B-1 PO) Take 0.5 tablets by mouth  daily.       Discharge Medications: Please see discharge summary for a list of discharge medications.  Relevant Imaging Results:  Relevant Lab Results:  Additional Information    Guadelupe Sabin, LCSW

## 2016-07-16 NOTE — ED Notes (Signed)
Emily Porter refused vital signs

## 2016-07-16 NOTE — ED Triage Notes (Signed)
Pt requested sandwich and drink, denies discomfort, watching TV in room, continue to monitor status

## 2016-07-17 MED ORDER — LORAZEPAM 1 MG PO TABS
2.0000 mg | ORAL_TABLET | Freq: Once | ORAL | Status: AC
  Administered 2016-07-17: 2 mg via ORAL
  Filled 2016-07-17: qty 2

## 2016-07-17 NOTE — Progress Notes (Signed)
Mali, RN came back to talk to patient.

## 2016-07-17 NOTE — ED Notes (Signed)
Patient given sandwich, drink(soda) and cheese

## 2016-07-17 NOTE — ED Notes (Signed)
Pt lying in bed with teddy bear. Pt state's the teddy bear's name is Johntay and sometimes he talks to me.

## 2016-07-17 NOTE — ED Notes (Signed)
Pt given sandwich and soda. Explained to pt that she would not be able to get another sandwich or soda during the night. Pt verbalized understanding.

## 2016-07-17 NOTE — Progress Notes (Signed)
Pt made last phone call for today. Informed patient of rules in TCU.

## 2016-07-17 NOTE — ED Notes (Signed)
Pt has become increasingly agitated. Ativan 2mg  PO given.

## 2016-07-17 NOTE — ED Notes (Signed)
Pt came out room asking if she could use the phone to call her social worker. Explained to pt that no phone calls were allowed after 9pm and that she would be able to make a phone call at [place] verbalized understanding and returned her bed.

## 2016-07-17 NOTE — ED Notes (Addendum)
Pt asking for albuterol inhaler and saline nasal spray, stating her "sinuses are stopped up". Albuterol inhaler administered. Pt frequently asking for soda, sandwiches and crackers. Informed pt that she can have water at this time and that she was given crackers and a sandwich at previously before bed and would not receive an additional sandwich at this time.

## 2016-07-17 NOTE — ED Notes (Addendum)
Pt walks out of the door asking for a soda. Informed pt that soda was not given after 9pm, but she could have water. Pt becomes agitated and states "I haven't had anything", walks back into the room and closes the door. Upon going in the pt's room wet spots noted on the floor. Pt was then asked if she had urinated on the floor, and pt states she spilled something on the floor. Pt then states she did urinate and scrubs and bed linen were noted to be wet as well. Pt assisted with set up for a bath. Linen changed and new scrubs provided. Pt given prn vistaril to decrease agitation.

## 2016-07-17 NOTE — Progress Notes (Signed)
Pt refuses to stay in room and keep door open. Yelling into hallway. Frequently calling for staff outside of door.

## 2016-07-17 NOTE — Progress Notes (Addendum)
CSW spoke with [employer], Admissions at [facility]. She stated this patient had been to their sister facility and patient would not be allowed admission at [facility]. Asst. Director of Social Work updated.   CSW staffed case with Asst. Director of Social Work. Patient's case will be sent to group homes through the hub for placement. Information sent pending request.  Call received from [facility] with [place] [phone]. She stated she reviewed patient's information and she will not be able to accept patient at her group home.   Genice Rouge [id number] ED CSW 07/17/2016 12:13 PM

## 2016-07-17 NOTE — ED Notes (Signed)
Pt excessively loud asking for the date and day of the week. Notified pt that it was Friday the 28th. Upon leaving pt's room pt states she missed a holiday and wishes the nurse a "happy mother's day".

## 2016-07-17 NOTE — ED Notes (Signed)
Pt walked to the nurse's station asking if she could shower. Informed pt she would not be able to shower at this time and that she bathed approximately an hour prior to pt asking to bathe again. Explained to pt that she would be able to take a shower later in the morning if needed.
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
## 2016-07-18 NOTE — ED Notes (Signed)
Patient given snack of sandwich and diet non caffeine Coke.

## 2016-07-18 NOTE — Progress Notes (Signed)
Patient requesting to speak to social worker and demanding more food redirection ineffective.Security notified. Patient advised this would be her last snack until dinner. Social worker informed of patient's request to speak with her.

## 2016-07-18 NOTE — ED Notes (Signed)
Patient requesting that I order her another tray, informed patient that I could not I gave patient a sandwich. Patient remains unsatisfied yelling and cursing.

## 2016-07-18 NOTE — ED Notes (Signed)
Patient at desk asking to use phone but having difficulty dialing even with assistance from nurse.  Talking in a loud voice and about things which are difficult to understand.

## 2016-07-18 NOTE — ED Notes (Signed)
Pt yelling "Nurse,nurse, can I see you for a minute?" When the the nurse went into the pt's room, pt asked "can you do me a favor, can you bring me a wig tomorrow?" Notified the pt that nursing staff would not be able to bring a wig tomorrow.

## 2016-07-18 NOTE — ED Notes (Signed)
Patient up in room yelling stating she needs help dressing and changing her bed.  Patient is up in the room walking around and bathing herself in the room.

## 2016-07-18 NOTE — ED Notes (Signed)
Pt frequently yelling out to nurse at the nurse's station asking for soda, stating she has been good all day and that she was hungry.  Notified pt that she could not have a soda at this time but she could have a cup of water and graham crackers until breakfast was served later in the morning. Pt provided with water and graham crackers. Pt cooperative at this time.

## 2016-07-18 NOTE — ED Notes (Signed)
Social  worker in with patient

## 2016-07-18 NOTE — ED Notes (Signed)
Patient redirected to stay in room and not wander up to desk asking to use the phone.  Patient playing with her teddy bear covering it up and murmuring to it.

## 2016-07-18 NOTE — ED Notes (Signed)
Patient sleeping, breathing even and unlabored, NAD at this time, will continue to monitor

## 2016-07-18 NOTE — ED Notes (Signed)
Patient breathing even and unlabored, NAD at this time, will continue to monitor patient.

## 2016-07-18 NOTE — ED Notes (Signed)
Patient sleeping, breathing even and unlabored, NAD at this time, will continue to monitor.

## 2016-07-18 NOTE — ED Notes (Signed)
Patient resting at this time will check vital signs when patient wakes up.

## 2016-07-18 NOTE — ED Notes (Signed)
Patient extremely agitated cursing walking around unit advised patient to return to room patient yelling derogatory  comments advised I was calling security patient returned to room.

## 2016-07-19 ENCOUNTER — Emergency Department (HOSPITAL_COMMUNITY): Payer: MEDICAID

## 2016-07-19 MED ORDER — GUAIFENESIN ER 600 MG PO TB12
1200.0000 mg | ORAL_TABLET | Freq: Two times a day (BID) | ORAL | Status: DC
  Administered 2016-07-19 – 2016-07-21 (×4): 1200 mg via ORAL
  Filled 2016-07-19 (×5): qty 2

## 2016-07-19 NOTE — ED Notes (Signed)
Pt refused Vital signs .

## 2016-07-19 NOTE — ED Notes (Signed)
Pt will not let me get her vitals

## 2016-07-19 NOTE — ED Notes (Signed)
Received notification from secretary that patient has been Sports administrator numerus times asking them to connect her to various phone numbers. Will speak to patient regarding phone privileges.

## 2016-07-19 NOTE — ED Notes (Signed)
Patient refusing to have vital signs taken at this time.

## 2016-07-19 NOTE — ED Notes (Signed)
Bed: [id number] Expected date:  Expected time:  Means of arrival:  Comments: Hold rm 31

## 2016-07-19 NOTE — ED Notes (Addendum)
Pt making exaggerated gasping noises and yelling at this rn to get her an inhaler, now. Pt informed that the writer does not like getting yelled at.

## 2016-07-19 NOTE — ED Notes (Addendum)
Pt permitted the writer to take her vital signs. She requested something to eat . Pt stated, "I am starved all the time. " Food and drink given to the pt. Instructed pt and reinforced the importance of pt voiding in the BR and keeping her room clean.Pt c/o head stuffiness and stated the ocean spray does not work. Spoke to PA about the pts concerns. (8pm)Pt has a cough w/o production. Spoke with PA and pt will go for a CXR. Lung sounds clear in all fields. Pulse ox remains in the high 90's to 100. Pt states she has a hx of pneumonia. Pt taken for CXR via wheelchair in the dept. (8:05am )Pt appears to be very manipulative at times. She wet her bed and stated, 'I did not know what I was doing. " Pt told the nurse she did not know how to change sheets so pt watched as the nurse showed her what to do. Pt continues to be reminded to go to the BR and to keep her room neat. (9:30pm)Pt requested her door stay all the way open and said,"I am afraid of the dark. "Pt continues to blow her nose all over her room and wipe her secretions all over. Pt instructed to use her tissues.
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
## 2016-07-19 NOTE — ED Notes (Addendum)
Patient complaining of not being able to breathe, she states she needs a breathing treatment. I offered patient prn inhaler she refused. Lung clear to auscultation. Patient having nasal congestion refusing nasal spray. Patient requesting something else.

## 2016-07-20 ENCOUNTER — Encounter (HOSPITAL_COMMUNITY): Payer: Self-pay

## 2016-07-20 LAB — BASIC METABOLIC PANEL
Anion gap: 9 (ref 5–15)
BUN: 34 mg/dL — AB (ref 6–20)
CALCIUM: 9.8 mg/dL (ref 8.9–10.3)
CHLORIDE: 108 mmol/L (ref 101–111)
CO2: 24 mmol/L (ref 22–32)
CREATININE: 1.3 mg/dL — AB (ref 0.44–1.00)
GFR calc Af Amer: 52 mL/min — ABNORMAL LOW (ref >60)
GFR calc non Af Amer: 45 mL/min — ABNORMAL LOW (ref >60)
Glucose, Bld: 111 mg/dL — ABNORMAL HIGH (ref 65–99)
Potassium: 3.4 mmol/L — ABNORMAL LOW (ref 3.5–5.1)
SODIUM: 141 mmol/L (ref 135–145)

## 2016-07-20 MED ORDER — LAMIVUDINE 150 MG PO TABS
300.0000 mg | ORAL_TABLET | Freq: Every day | ORAL | Status: DC
  Administered 2016-07-21: 300 mg via ORAL
  Filled 2016-07-20: qty 2

## 2016-07-20 NOTE — ED Notes (Signed)
Patient refuses to have her vital signs taken.

## 2016-07-20 NOTE — ED Notes (Signed)
Pt declined vitals.

## 2016-07-20 NOTE — ED Notes (Signed)
Social work  Musician to come see Jana Half about room 25.
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
## 2016-07-20 NOTE — Progress Notes (Addendum)
Staffed with Nurse.   Spoke with Colletta Maryland with the [facility] who states their Housing Specialist is seeking placement for patient and at this time they have all been declines. She stated the specialist is contacting a place in [place] again and they will be consistent to see if they will accept patient at that facility. Colletta Maryland, [facility] [phone]  Staffed case with Nurse. CSW was informed patient wanted to speak with Education officer, museum. Spoke with patient at bedside with no one present. CSW provided supportive encouragement to patient and informed her that CSW was seeking placement for her at this time. Patient reports her family resides in [place] and she has not spoken to her family in ten years. Patient noted no other supports at this time. CSW spoke with patient about patience and being cooperative with staff to ensure appropriate care. Patient expressed that she understood. Nurse updated.  CSW sent patient's information through the hub on Friday to various group homes for placement. CSW followed up with group homes by phone:  [facility], left message [phone] [facility], left message [place], spoke with Caryl Pina and they only accept men [facility], left message [phone] [place] [facility], left message [phone] [facility], left message [phone] [facility], [place], spoke with Otila Kluver and they are currently at [place], voicemail box not set up [phone] [facility], no answer [place], number was to a fax machine [phone] [facility], spoke with Rockne Coons [phone]. She stated they do accept women and she would have the owner call back in the morning. Name and contact provided. [place], spoke with Jonelle Sidle [phone]. She stated they are at capacity.       Genice Rouge [id number] ED CSW 07/20/2016 12:48 PM

## 2016-07-20 NOTE — ED Notes (Signed)
PT state needle hurting arm. Unable to collect labs

## 2016-07-21 MED ORDER — SENNA 8.6 MG PO TABS
1.0000 | ORAL_TABLET | Freq: Two times a day (BID) | ORAL | Status: DC
  Administered 2016-07-21 – 2016-07-30 (×18): 8.6 mg via ORAL
  Filled 2016-07-21 (×18): qty 1

## 2016-07-21 MED ORDER — AMANTADINE HCL 100 MG PO CAPS
100.0000 mg | ORAL_CAPSULE | Freq: Two times a day (BID) | ORAL | Status: DC
  Administered 2016-07-21 – 2016-07-30 (×18): 100 mg via ORAL
  Filled 2016-07-21 (×18): qty 1

## 2016-07-21 MED ORDER — BUPROPION HCL ER (SR) 150 MG PO TB12
150.0000 mg | ORAL_TABLET | Freq: Two times a day (BID) | ORAL | Status: DC
  Administered 2016-07-21 – 2016-07-30 (×19): 150 mg via ORAL
  Filled 2016-07-21 (×23): qty 1

## 2016-07-21 MED ORDER — LISINOPRIL 10 MG PO TABS
10.0000 mg | ORAL_TABLET | Freq: Every day | ORAL | Status: DC
  Administered 2016-07-22 – 2016-07-30 (×9): 10 mg via ORAL
  Filled 2016-07-21 (×12): qty 1

## 2016-07-21 MED ORDER — HYDROCHLOROTHIAZIDE 12.5 MG PO CAPS
25.0000 mg | ORAL_CAPSULE | Freq: Every day | ORAL | Status: DC
  Administered 2016-07-22 – 2016-07-30 (×9): 25 mg via ORAL
  Filled 2016-07-21 (×11): qty 2

## 2016-07-21 MED ORDER — GABAPENTIN 100 MG PO CAPS
100.0000 mg | ORAL_CAPSULE | Freq: Three times a day (TID) | ORAL | Status: DC
  Administered 2016-07-21 – 2016-07-30 (×28): 100 mg via ORAL
  Filled 2016-07-21 (×28): qty 1

## 2016-07-21 MED ORDER — LAMIVUDINE 150 MG PO TABS
300.0000 mg | ORAL_TABLET | Freq: Every day | ORAL | Status: DC
Start: 2016-07-22 — End: 2016-07-31
  Administered 2016-07-22 – 2016-07-30 (×9): 300 mg via ORAL
  Filled 2016-07-21 (×11): qty 2

## 2016-07-21 MED ORDER — TRAZODONE HCL 50 MG PO TABS
50.0000 mg | ORAL_TABLET | Freq: Every day | ORAL | Status: DC
  Administered 2016-07-21 – 2016-07-30 (×9): 50 mg via ORAL
  Filled 2016-07-21 (×9): qty 1

## 2016-07-21 MED ORDER — ATAZANAVIR SULFATE 200 MG PO CAPS
400.0000 mg | ORAL_CAPSULE | Freq: Every day | ORAL | Status: DC
  Administered 2016-07-22 – 2016-07-31 (×9): 400 mg via ORAL
  Filled 2016-07-21 (×12): qty 2

## 2016-07-21 MED ORDER — LAMIVUDINE 150 MG PO TABS: 300.0000 mg | ORAL_TABLET | Freq: Four times a day (QID) | ORAL | Status: DC

## 2016-07-21 MED ORDER — LOXAPINE SUCCINATE 25 MG PO CAPS
25.0000 mg | ORAL_CAPSULE | Freq: Three times a day (TID) | ORAL | Status: DC
  Administered 2016-07-21 – 2016-07-30 (×28): 25 mg via ORAL
  Filled 2016-07-21 (×33): qty 1

## 2016-07-21 MED ORDER — ABACAVIR SULFATE 300 MG PO TABS
600.0000 mg | ORAL_TABLET | Freq: Every day | ORAL | Status: DC
  Administered 2016-07-22 – 2016-07-30 (×9): 600 mg via ORAL
  Filled 2016-07-21 (×12): qty 2

## 2016-07-21 MED ORDER — ATAZANAVIR SULFATE 200 MG PO CAPS: 400.0000 mg | ORAL_CAPSULE | Freq: Four times a day (QID) | ORAL | Status: DC

## 2016-07-21 MED ORDER — ABACAVIR SULFATE 300 MG PO TABS: 600.0000 mg | ORAL_TABLET | Freq: Four times a day (QID) | ORAL | Status: DC

## 2016-07-21 NOTE — Progress Notes (Addendum)
Staffed patient's case in LOS this AM. Spoke with patient about her income and she stated she receives $738 dollars in social security. She stated she also receives $548 dollars, however, she does not know why she receives this money.   Attempted call to patient's sister listed in the chart, Mikle Bosworth and was informed she was not in at the time.  CSW spoke with Allene Pyo with [facility]. She stated she did not received patient's information through [place]. CSW faxed information for review. She stated she would speak with her supervisor and call back on tomorrow.    Genice Rouge [id number] ED CSW 07/21/2016 1:14 PM

## 2016-07-21 NOTE — ED Notes (Signed)
Bed: WA31 Expected date:  Expected time:  Means of arrival:  Comments: 

## 2016-07-21 NOTE — Progress Notes (Signed)
Call from [facility] pharmacist with concern for pt HIV management He has spoken with Hassan Rowan at [place] [id number] and [facility] pharmacist with attempt to find MD/ID MD who originally ordered medications for pt ED CM spoke with [place] (whom  Dian Situ had spoken with earlier) about ID MD consult  Venora Maples offering to attempt to find Past ID MD by attempting to get records for [facility] via care everywhere

## 2016-07-21 NOTE — Progress Notes (Signed)
ED CM called [facility] at [phone]  No answer LVM for a return call to ED CM mobile number for assistance with medication management

## 2016-07-21 NOTE — ED Notes (Signed)
Patient became upset and frustrated, yelling at writer because med scanner was not reading her arm band right away. Patient has stated that she is afraid of the noises coming from upstairs. I previously discussed with patient that noised are easily heard between the floors above.

## 2016-07-21 NOTE — Progress Notes (Signed)
Spoke again with EDP, campos when medicines updated and pharmacist, Dian Situ Still pending call from [facility] to assist with past medical providers

## 2016-07-21 NOTE — ED Notes (Signed)
Pt. Getting cleaned up in shower with NT. Pt. Room cleaned up and new linens changed.

## 2016-07-21 NOTE — Progress Notes (Signed)
Return call from Staff at [facility] to inform Cm pt has not been seen by their provider Lianne Moris in a year and is not aware of an ID MD that was seeing pt  Pt was seen by Dr Lianne Moris at the [facility] facility in [place], [place]  [address] [place], [place] [address] [phone] Discussed that [facility] is still listed on the medicaid card

## 2016-07-22 NOTE — ED Notes (Signed)
Patient being loud and upset.  Nurse putting clean sheets on the bed and offering patient a snack.  Patient wanted nurse to help her put on an attends but she was so agitated that nurse left room due to safety issues.

## 2016-07-22 NOTE — ED Notes (Signed)
Patient asking to wash up.  Set up patient in bathroom and started shower.  Patient yelling at nurse and assistants that she wants to be left alone.  Patient becoming very disruptive in shower covering her body with lotion but not showering.  Security on standby for assistance.

## 2016-07-22 NOTE — ED Notes (Addendum)
Pt belongings in locker #32

## 2016-07-22 NOTE — Progress Notes (Addendum)
CSW received call from Maximino Sarin, Pharmacologist with [facility]. She stated she would not be able to accept patient at her facilities, as patient is not in need of PCS, [place]. She stated she would contact other facilities and call back.  Spoke with Maximino Sarin, Marketing who stated she called [address] [place], and [facility] and they will not be able to accept patient, as patient is not in need of PCS, [employer].  Attempted a telephone call to [place] with [facility]. Mailbox is full, unable to leave message.   Attempted a telephone call to Eliot Ford with [facility]. Mailbox is full, unable to leave message. Called back and left message with name and contact for return call.   Genice Rouge [id number] ED CSW 07/22/2016 11:49 AM

## 2016-07-22 NOTE — ED Notes (Signed)
Patient dumped soft drink in the floor and flung her chicken against the wall. Area cleaned and patient refused to have writer throw the chicken away, Chicken now laying on the cabinet.

## 2016-07-22 NOTE — Progress Notes (Addendum)
CSW received a call from patient's Nurse for today. She stated she received note from [place] and patient is in need of assistance with bathing and dressing. Nurse states patient needs cues and commands, as patient does not follow through with instructions or what she says she will do at that time. She noted patient is childlike and she could benefit from assistance.   CSW called and spoke with Maximino Sarin, Marketing with [facility] and [place]. CSW informed her after speaking with patient's Nurse today, patient does need assistance with bathing and dressing. CSW faxed information again and she stated she would review. She also noted she will come and assess patient either today or tomorrow to see if she would be appropriate for their facility.  Spoke with patient and offered supportive encouragement. CSW informed patient that no placement has been found at this time.   Genice Rouge [id number] ED CSW 1:46 PM 07/22/2016

## 2016-07-22 NOTE — ED Notes (Signed)
Patient very hyperactive, not able to sit still and focus for any length of time.  Not resting.  Night nurse said patient did not sleep well during the night and was awake much of the time.

## 2016-07-22 NOTE — ED Notes (Signed)
Patient incontinent of urine on the bed and on the floor.

## 2016-07-22 NOTE — ED Notes (Signed)
Bed: [id number] Expected date:  Expected time:  Means of arrival:  Comments: HOLD for TCU

## 2016-07-22 NOTE — ED Notes (Signed)
Patient saying "fuck you" in a loud voice in her room.  Patient was by herself when this occurred and wasn't connected to any incident.  Patient seems to change moods frequently and very suddenly.

## 2016-07-22 NOTE — ED Notes (Signed)
Patient has been labile throughout the night.  Patient cursing and irritable with staff.

## 2016-07-22 NOTE — ED Notes (Signed)
Patient lying in bed calm and cooperative.

## 2016-07-22 NOTE — Progress Notes (Signed)
Pt noted to visit TCU nursing station x 3 while within a 25 minute time frame Pt asking for ED SW ans asking if a place had been found for her yet. On the first visit pt came up to ED CM to ask if she was SW.  The second time she visited the [place] The third time she visited CM was able to ask pt various questions but the answers were not detailed or beneficial Pt states she does not know name of her pcp, ID MD nor have contact numbers for other family other than her sister in [place] Pt confirms she is originally from [facility]. Pt confirms she has been in facility in [place] and "[place] came to get me out" Pt noted with fast, garble speech related to not having her teeth in and mild stuttering noted throughout conversation.  Pt noted to repeat statements at intervals Pt heard speaking with someone but no one was in her room.

## 2016-07-22 NOTE — ED Notes (Signed)
Patient calling a business and repeatedly asking for someone.  Business called ED back and asked for patient not to call them again.  Nurse obtained portable phone from patient.

## 2016-07-22 NOTE — ED Notes (Signed)
Patient calm now asking for a coke.  Nurse gave patient coke then she went into her room and closed the door.
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
## 2016-07-23 NOTE — ED Notes (Signed)
Pt awake and asking for medications.  Pt took medications without any issues.  Pt requesting a [place] sandwich and one was given to pt.  Pt currently sitting on edge of bed eating sandwich.

## 2016-07-23 NOTE — ED Notes (Signed)
After vitals PT went back to sleep.

## 2016-07-23 NOTE — ED Notes (Signed)
Social Worker at bedside talking to pt.

## 2016-07-23 NOTE — ED Notes (Signed)
Pt given a coke 

## 2016-07-23 NOTE — ED Notes (Signed)
Chest expansion symmetrical with non-labored breathing.

## 2016-07-24 NOTE — ED Notes (Signed)
NO SITTER AT THIS TIME. CHARGE MACON RN MADE AWARE

## 2016-07-24 NOTE — ED Notes (Signed)
Pt. Refused vital signs. 

## 2016-07-24 NOTE — Progress Notes (Addendum)
Attempted call to Maximino Sarin, Marketing with [facility]. Message left with name and contact for return call.  Attempted call to Eliot Ford with [facility]. Message left with name and contact for return call.  Spoke with [place] at [facility], [place] who states they do not accept Medicaid and would not be able to accept patient.  CSW received call back from [facility], Pharmacologist with [facility]. She stated Mia, Development worker, international aid from their facility did come yesterday to complete an assessment on patient. She stated after being assessed by the Executive Director, patient is too independent and is not in need of PCS ([place]). She stated they would not be able to accept patient at their facility.  CSW spoke with patient at bedside, along with Kingsley Spittle, 2nd Shift CSW [facility]. Patient wanted to know if she would be going to the facility that came to see her on yesterday. Patient was informed that she would not be going to that facility. CSW offered supportive encouragement to patient.   Genice Rouge [id number] ED CSW 07/24/2016 10:45 AM

## 2016-07-25 NOTE — Progress Notes (Addendum)
11am -Pt became very aggressive and agitated stating,"get the hell out of my room and get off of me." Informed pt that her mediations needed to be taken. Pt stated, "I ain't taking no medications. Now close that god damn door and leave me alone. Do not send the police to get me either." Pts primary nurse made aware. Pt refused to have a new armband placed. Per primary nurse she will attempt later to to give pts her medications. (11:10am )Pt did take meds from another nurse. Pt requested a coke and was given this. (11:20am )

## 2016-07-25 NOTE — ED Notes (Signed)
Pt. Asleep at this time. To give meds once awake.

## 2016-07-26 MED ORDER — ZIPRASIDONE MESYLATE 20 MG IM SOLR: 10.0000 mg | Freq: Once | INTRAMUSCULAR | Status: DC

## 2016-07-26 MED ORDER — ZIPRASIDONE HCL 20 MG PO CAPS
20.0000 mg | ORAL_CAPSULE | Freq: Once | ORAL | Status: AC
  Administered 2016-07-26: 20 mg via ORAL
  Filled 2016-07-26: qty 1

## 2016-07-26 NOTE — ED Notes (Signed)
Food and drink given per request, no respiratory or acute distress noted alert and oriented states no SI or HI voiced call light in reach.

## 2016-07-26 NOTE — Progress Notes (Addendum)
Pt is very loud and demanding this am. Pt was screaming in the BR and tearing up pieces of paper towel. Pt instructed not to throw the paper in the toilet. Spoke to EDP concerning med administration to calm the pt. Security with pt due to her aggressive behavior. Pt has been trying to slam the door and sit near other pts. Pt does appear to respond when she sees security.(9:30am )pt continues with have intrusive behavior and constantly has to be redirected back to her room. (10:45am )Report to oncoming shift. (11:20am )

## 2016-07-26 NOTE — ED Notes (Signed)
Pt requesting to speak with a Education officer, museum.  Social worker informed and will come see pt when she has time.  Pt currently resting.

## 2016-07-26 NOTE — ED Notes (Signed)
Pt sleeping.  Snoring respirations heard.

## 2016-07-26 NOTE — ED Notes (Signed)
No respiratory or acute distress noted alert and oriented x 3 call light in reach no SI or HI voiced no reaction to medication noted.

## 2016-07-26 NOTE — ED Notes (Signed)
Request/advised pt vitals measurement needed. Pt refuses x3 from 0930 to 1030 to allow vitals to be measured, states 'do not touch me, I am fine'. Will attempt at later time during shift. ENM

## 2016-07-26 NOTE — Clinical Social Work Note (Addendum)
CSW spoke with Eliot Ford of [facility] who reported that he had received a message from weekday CSW and was working on trying to place patient in an ALF home.  CSW provided patient's care coordinator information  (Ray Owens Shark of [facility]) and Montine Circle stated that he was going to follow up with him tomorrow and also come to the hospital to meet with patient tomorrow but it would not be until after 3pm.  CSW provided additional information regarding patient's health and history.  CSW will be meeting with patient today to report what steps are being made to secure her a new home.  Dede Query, LCSW [place] Worker - Weekend Coverage cell #: [phone]

## 2016-07-26 NOTE — ED Notes (Signed)
Pt given shower in tcu shower

## 2016-07-26 NOTE — ED Notes (Signed)
Request/Advised pt of vitals check. Pt has refused vitals x2, states 'I don't want anyone touching my arm, leave me alone'. Will attempt again in 32min approx. ENM

## 2016-07-26 NOTE — Clinical Social Work Note (Signed)
CSW met with patient at bedside to provide update of visit scheduled for tomorrow.  Patient is concerned about where her picture ID card is and CSW looked through her things that are locked up and it was not there.  CSW provided this information to patient who stated that she was concerned that it was still with her last group home and she wanted to make sure that she got it and her checks back.  CSW provided supportive listening and stated that once she was ready for discharge the things that she left at her last group home would be rounded up.  Dede Query, LCSW [place] Worker - Weekend Coverage cell #: [phone]

## 2016-07-26 NOTE — ED Notes (Signed)
Pt taken to the TCU shower by staff to have a shower.

## 2016-07-26 NOTE — ED Notes (Signed)
No respiratory or acute distress noted alert and oriented call light in reach no reaction to medication noted.

## 2016-07-26 NOTE — ED Notes (Signed)
No respiratory or acute distress noted resting in bed alert and talking states no SI or HI voiced at this time, call light in reach no reaction to medication noted.

## 2016-07-26 NOTE — ED Notes (Addendum)
Pt states she is still hungry after her lunch tray.  Pt given a ham sandwich and a coke.  Pt also wants to make sure her belongings are in her locker.  Pt's belongings remain in locker 36.

## 2016-07-26 NOTE — ED Notes (Signed)
Changed pt attends pad and changed bed and linen and cleaned up pts room, no respiratory or acute distress noted alert and talking call light in reach denies SI or HI.

## 2016-07-27 MED ORDER — DIAZEPAM 5 MG/ML IJ SOLN
10.0000 mg | Freq: Once | INTRAMUSCULAR | Status: AC
  Administered 2016-07-27: 10 mg via INTRAMUSCULAR
  Filled 2016-07-27: qty 2

## 2016-07-27 NOTE — ED Notes (Signed)
Pt was given diet soda. Only aloud to have 3.

## 2016-07-27 NOTE — ED Notes (Signed)
Pt acting out slamming doors and cursing at staff informed doctor of event medication ordered.

## 2016-07-27 NOTE — ED Notes (Signed)
Pt states does not want a breakfast meal tray but [place] sandwich instead. Pt given [place] sandwich.

## 2016-07-27 NOTE — ED Notes (Signed)
SW at bedside.

## 2016-07-27 NOTE — ED Notes (Signed)
Pt given diet coke and pt phone call x3.

## 2016-07-27 NOTE — ED Notes (Signed)
Bed: WA32 Expected date:  Expected time:  Means of arrival:  Comments: 

## 2016-07-27 NOTE — ED Notes (Signed)
No respiratory or acute distress noted alert and talking no SI or HI voiced call light in reach drink given.

## 2016-07-27 NOTE — ED Notes (Signed)
Pt phone call x1.

## 2016-07-27 NOTE — ED Notes (Signed)
Changed pt bed with new sheets and blankets.

## 2016-07-27 NOTE — ED Notes (Signed)
Pt given a turkey sandwich

## 2016-07-27 NOTE — ED Notes (Signed)
Pt still walking halls cursing refuses to stay in room when asked to go in room sits in hall in chair. Charge nurse informed.

## 2016-07-27 NOTE — Progress Notes (Addendum)
CSW faxed patient's information to [facility], Admissions [phone] and [facility], Admissions [phone] for these to see if patient would be appropriate. CSW will follow-up on referrals.  2:25pm- CSW spoke with patient at patient's request per Nurse. Patient was informed that no contact had been made with the individual that was to come and assess her on today at the time. Supportive encouragement offered to patient during the visit.  2:49pm- CSW attempted another telephone call to Eliot Ford with [facility] [phone] to see if he was coming to assess patient on today. Message left with name and contact number for return call.    Genice Rouge [id number] ED CSW 07/27/2016 2:55 PM

## 2016-07-27 NOTE — ED Notes (Signed)
Pt phone call x2.

## 2016-07-27 NOTE — ED Notes (Signed)
Pt taking shower at present time.

## 2016-07-27 NOTE — ED Notes (Signed)
No respiratory or acute distress noted no reaction to medication noted pt more calm and resting in bed call light in reach.

## 2016-07-27 NOTE — ED Notes (Signed)
No respiratory or acute distress noted resting in bed with eyes closed no reaction to medication noted call light in reach. 

## 2016-07-27 NOTE — ED Notes (Signed)
Talked with charge nurse about pt becoming more agitated med given earlier informed we need sitter for pt. No reaction to medication noted no respiratory or acute distress noted alert and talking but unable to understand what pt says.

## 2016-07-27 NOTE — Progress Notes (Addendum)
CSW spoke with patient at bedside and informed her that someone from a facility may come to visit her today for an assessment. Patient stated she may take a shower. CSW informed patient she will be notified if the person does come today. CSW provided supportive encouragement and offered supportive listening during the visit.  Attempted call to Eliot Ford, [place] [phone] to see if he will be coming to assess patient on today. Message left with name and contact for return call.   Genice Rouge [id number] ED CSW 07/27/2016 12:54 PM

## 2016-07-28 ENCOUNTER — Emergency Department (HOSPITAL_COMMUNITY): Payer: MEDICAID

## 2016-07-28 NOTE — Progress Notes (Addendum)
CSW spoke with Eliot Ford with [facility] [phone]. He stated he arrived back in town late yesterday and he plans to come and assess patient either today or tomorrow to see if she would be appropriate for the [facility] ([facility] home. He stated he will speak with patient's Care Coordinator as well. CSW will continue to follow-up.  Call made for follow-up to [facility] [phone] and CSW was informed that [place] Nurse, Admissions will not be in until later. Will follow-up.   Genice Rouge [id number] ED CSW 07/28/2016 8:24 AM

## 2016-07-28 NOTE — ED Notes (Signed)
Patient awakened irritable and agitated.  Patient urinated all over her room floor.  Patient then went into hallway bathroom and slammed the door.

## 2016-07-28 NOTE — Progress Notes (Addendum)
CSW spoke with Lucendia Herrlich, Care Coordinator with [facility] to inform him that Suanne Marker with [facility] wanted to speak with him to obtain more information regarding patient. CSW provided him with her contact number.   He stated he had contacted other places since speaking by phone today. He stated he contacted [facility], [facility], [place] and [facility], [place], [place]. After discussing patient in the LOS meeting, CSW asked Care Coordinator if he could fax a copy of his notes for any facilities he has contacted for patient. He stated he would and fax number was provided to send information [phone].   He also stated he spoke with Eliot Ford with [facility] regarding funding for patient. He stated he was going to complete the process and attempt to expedite to see if patient would be allowed to receive extra funding for placement. He stated patient does not have the the innovations waiver.    Genice Rouge [id number] ED CSW 07/28/2016 3:10 PM

## 2016-07-28 NOTE — Progress Notes (Addendum)
CSW spoke with Suanne Marker, administrater with [facility] in [place]. [facility] is able to give bed offer on Friday 8/11.   [facility] [employer] [facility] requests faxed updated FL2 and TB test/chest x-ray to be completed before discharge on Friday.   Fax #: [phone] Cell #: [phone]

## 2016-07-28 NOTE — Progress Notes (Signed)
ED CM spoke with pt when she came to ED nursing station Pt inquiring about what happen to the person coming to visit her today Cm discussed with her that he may have been delayed but would visit her.  She asked about Sw and CM discussed that she may be seen by PM SW later today Pt pleasant Pt noted to continue to stutter and have word finding difficulty when speaking with staff  Cm contacted [facility] and spoke with Jinny Blossom SW there to inquire if there are possibly any suggestion of facilities that provided care primarily to HIV clients Jinny Blossom shared that a recent pt of hers was discharged to [facility] [place] but generally there are no designated HIV facilities locally

## 2016-07-28 NOTE — ED Notes (Signed)
Patient refused medications.  Patient states, "Get out of my room!"

## 2016-07-28 NOTE — ED Notes (Signed)
Bed: WA09 Expected date:  Expected time:  Means of arrival:  Comments: TCU patient

## 2016-07-28 NOTE — Progress Notes (Addendum)
Patient's case discussed in the LOS meeting and was informed to search in the [place] for placement.  Spoke with Lucendia Herrlich, Care Coordinator with [facility] and asked for a list of facilities he has contacted for placement. He stated he has only called two facilities: 1. [facility], [place], [place] and 2. [facility], [place]. He stated he has contacted other facilities, however, once he provides patient's name, patient is declined. He stated again patient has "burned a lot of bridges" at previous placements. CSW asked again for his assistance in locating a placement. CSW asked for call back on any referrals or leads and provided name and contact number for return call.  Message left for social worker at [facility] [phone] return call seeking assistance with contacts in their area for placement.   CSW contacted [facility], [place], [place] and spoke with [facility] She stated they currently do not have any beds available.   CSW spoke with [place] with [facility], [place] [phone] who states they do not have anything available for mental health at the time.  CSW spoke with Aftan Freeman-Winters with [facility] [phone], [place], [place]. She stated they primarily work with patients with an IDD diagnosis.  CSW received call from Suanne Marker, Administrator with [facility], [place], [place] [phone]. She stated she had availability at her Care Home and asked if patient's FL2 could be faxed for review. Information faxed to [phone]. She stated she would call back once the information has been reviewed.  Call received from Suanne Marker, Administrator with [facility], [place], [place]. She stated she would come today around 7:00 or 7:30 to assess patient to see if she would be appropriate for her facility.   Genice Rouge [id number] ED  CSW 07/28/2016 1:02 PM

## 2016-07-28 NOTE — Progress Notes (Addendum)
Report from outgoing shift. Pt is asleep with regular respirations. She does not appear in any distress-Sent a request for one of pts medications to the pharmacy. Phoned pharmacy back at 11:30am that pt still needs her medication. Per pharmacy they will send the medication.Spoke to [place] and pt will move to bed 9 in the main ED. (11:20am )Report to [place] , Therapist, sports. (12:15pm)

## 2016-07-28 NOTE — ED Notes (Signed)
Patient irritable and agitated.  Patient asked writer to leave her room.  Patient fussing, however Probation officer cannot what patient is saying.

## 2016-07-28 NOTE — Progress Notes (Signed)
CSW spoke with patient at bedside and offered supportive encouragement. CSW informed patient placement has not been found at this time and that the search continues to locate her a new placement.  Genice Rouge [id number] ED CSW 07/28/2016 8:53 AM

## 2016-07-28 NOTE — Progress Notes (Signed)
Suanne Marker, Administrator with [facility] visited with patient at 5:15pm. CSW will continue to update.   Kingsley Spittle, [place] Clinical Social Worker [phone]

## 2016-07-28 NOTE — ED Notes (Signed)
Patient in the shower

## 2016-07-29 ENCOUNTER — Emergency Department (HOSPITAL_COMMUNITY): Payer: MEDICAID

## 2016-07-29 NOTE — Progress Notes (Signed)
Spoke with wanda at [facility] to attempt to get assist with assigning this pt a new medicaid [place] access provider near [place] where pt will be possible locating  CM was referred to Carl Best at [facility] [phone] no ext [phone] called and left a message for "[employer] requesting a return to CM mobile number to get assist with follow care appt for pt near [facility]

## 2016-07-29 NOTE — [place] FL2 (Signed)
[place] MEDICAID FL2 LEVEL OF CARE SCREENING TOOL     IDENTIFICATION  Patient Name: Emily Porter Birthdate: Jun 14, 1956 Sex: female Admission Date (Current Location): 07/13/2016  [facility] and [place] Number:  Herbalist and Address:  [facility],  [place] [address] [place]      Provider Number: [id number]  Attending Physician Name and Address:  Provider Default, MD  Relative Name and Phone Number:       Current Level of Care: Hospital Recommended Level of Care: Other (Comment) (ALF, [place], [place]) Prior Approval Number:    Date Approved/Denied:   PASRR Number:    Discharge Plan: Other (Comment) (ALF, [place], [place])    Current Diagnoses: Patient Active Problem List   Diagnosis Date Noted  . 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    Orientation RESPIRATION BLADDER Height & Weight     Self, Situation, Place  Normal Continent Weight: 205 lb (93 kg) (Patient refused to stand on the scale for weight) Height:  5\' 8"  (172.7 cm)  BEHAVIORAL SYMPTOMS/MOOD NEUROLOGICAL BOWEL NUTRITION STATUS      Continent Diet (Per Nurse, regular diet)  AMBULATORY STATUS COMMUNICATION OF NEEDS Skin   Independent Verbally Normal                       Personal Care Assistance Level of Assistance  Bathing, Feeding, Dressing Bathing Assistance: Limited assistance (Per Nurse, patient needs assistance, cues and commands, does not follow through with what she states she will do) Feeding assistance: Independent Dressing Assistance: Limited assistance (Per Nurse, patient needs assistance, cues and commands, does not follow through with what she states she will do)     Functional Limitations Info  Sight, Hearing, Speech Sight Info: Adequate Hearing Info: Adequate Speech Info:  (Patient stutters, have to listen carefully per Nurse)    [place]  Blood pressure Blood Pressure Frequency: Per Nurse, patient on blood pressure  medication                  Contractures      Additional Factors Info  Code Status Code Status Info:  (Full Code) Allergies Info:  (Penicillins)           Current Medications (07/29/2016):  This is the current hospital active medication list Current Facility-Administered Medications  Medication Dose Route Frequency Provider Last Rate Last Dose  . abacavir (ZIAGEN) tablet 600 mg  600 mg Oral Daily Jola Schmidt, MD   600 mg at 07/28/16 1045  . albuterol (PROVENTIL HFA;VENTOLIN HFA) 108 (90 Base) MCG/ACT inhaler 2 puff  2 puff Inhalation Q6H PRN Daleen Bo, MD   2 puff at 07/28/16 1758  . amantadine (SYMMETREL) capsule 100 mg  100 mg Oral BID Jola Schmidt, MD   100 mg at 07/28/16 2136  . atazanavir (REYATAZ) capsule 400 mg  400 mg Oral Q breakfast Jola Schmidt, MD   400 mg at 07/28/16 1208  . buPROPion [facility] SR) 12 hr tablet 150 mg  150 mg Oral BID Jola Schmidt, MD   150 mg at 07/28/16 2136  . gabapentin (NEURONTIN) capsule 100 mg  100 mg Oral TID Jola Schmidt, MD   100 mg at 07/28/16 2136  . hydrochlorothiazide (MICROZIDE) capsule 25 mg  25 mg Oral Daily Jola Schmidt, MD   25 mg at 07/28/16 1044  . hydrOXYzine (ATARAX/VISTARIL) tablet 25 mg  25 mg Oral Q6H PRN Laverle Hobby, PA-C   25 mg at  07/28/16 1045  . lamiVUDine (EPIVIR) tablet 300 mg  300 mg Oral Daily Jola Schmidt, MD   300 mg at 07/28/16 1045  . lisinopril (PRINIVIL,ZESTRIL) tablet 10 mg  10 mg Oral Daily Jola Schmidt, MD   10 mg at 07/28/16 1045  . loxapine (LOXITANE) capsule 25 mg  25 mg Oral TID Jola Schmidt, MD   25 mg at 07/28/16 2136  . nicotine (NICODERM CQ - dosed in mg/24 hours) patch 14 mg  14 mg Transdermal Daily Daleen Bo, MD   14 mg at 07/27/16 0845  . nicotine polacrilex (NICORETTE) gum 4 mg  4 mg Oral Q2H PRN Daleen Bo, MD   4 mg at 07/24/16 1727  . senna (SENOKOT) tablet 8.6 mg  1 tablet Oral BID Jola Schmidt, MD   8.6 mg at 07/28/16 2136  . sodium chloride (OCEAN) 0.65 % nasal spray 1  spray  1 spray Each Nare PRN Harvel Quale, MD   1 spray at 07/26/16 1713  . traZODone (DESYREL) tablet 50 mg  50 mg Oral QHS Jola Schmidt, MD   50 mg at 07/28/16 2136   Current Outpatient Prescriptions  Medication Sig Dispense Refill  . abacavir (ZIAGEN) 300 MG tablet Take 600 mg by mouth daily.    [facility] albuterol (PROVENTIL HFA;VENTOLIN HFA) 108 (90 Base) MCG/ACT inhaler Inhale 2 puffs into the lungs every 6 (six) hours as needed for wheezing or shortness of breath.    [facility] amantadine (SYMMETREL) 100 MG capsule Take 100 mg by mouth 2 (two) times daily.    [facility] amLODipine (NORVASC) 10 MG tablet Take 10 mg by mouth daily.    [facility] atazanavir (REYATAZ) 200 MG capsule Take 400 mg by mouth daily with breakfast.    . buPROPion (WELLBUTRIN SR) 150 MG 12 hr tablet Take 150 mg by mouth 2 (two) times daily.    . cholecalciferol (VITAMIN D) 400 units TABS tablet Take 800 Units by mouth daily.    [facility] gabapentin (NEURONTIN) 100 MG capsule Take 100 mg by mouth 3 (three) times daily.    . haloperidol (HALDOL) 5 MG tablet Take 5 mg by mouth daily.    . hydrochlorothiazide (HYDRODIURIL) 25 MG tablet Take 25 mg by mouth daily.    [facility] isoniazid (NYDRAZID) 300 MG tablet Take 300 mg by mouth daily.    [facility] lamivudine (EPIVIR) 300 MG tablet Take 300 mg by mouth daily.    [facility] lisinopril (PRINIVIL,ZESTRIL) 40 MG tablet Take 40 mg by mouth daily.    . nicotine (NICODERM CQ - DOSED IN MG/24 HOURS) 14 mg/24hr patch Place 14 mg onto the skin daily.    . nicotine polacrilex (NICORETTE) 4 MG gum Take 4 mg by mouth every 2 (two) hours as needed for smoking cessation.    . senna (SENOKOT) 8.6 MG TABS tablet Take 1 tablet by mouth 2 (two) times daily.    . Thiamine HCl (VITAMIN B-1 PO) Take 0.5 tablets by mouth daily.       Discharge Medications:   Relevant Imaging Results:  Relevant Lab Results:   Additional [place], MD [phone]

## 2016-07-29 NOTE — ED Notes (Signed)
Pt. Still refusing vitals

## 2016-07-29 NOTE — ED Notes (Signed)
Pt states she wants to wash up and change clothes. Pt given change of scrubs and pt walked to the bathroom

## 2016-07-29 NOTE — ED Notes (Signed)
Pt. Refused vitals twice, asked pt. at 2 different times

## 2016-07-29 NOTE — Progress Notes (Addendum)
After discussing patient in the LOS meeting, CSW was advised to call [facility] for assistance with facilities in the [place] part of the state. CSW spoke with Threasa Beards, Resource Specialist with [facility] [phone] for assistance with placement resources. She stated she was aware of this patient from placing her previously at a facility in that area. She provided facilities for placement in the [place] the [place] if needed:  [facility] [phone], [place] is the contact [facility] [phone], Laverna Peace is the contact [facility] [phone], Zigmund Daniel is the contact [employer] [phone] [place] [phone] [facility], no #, Carmon Ginsberg is the contact  CSW spoke with patient per patient's request. Patient stated the lady she spoke with last night stated she may be able to take her Thursday or Friday. CSW informed patient that nothing was definite at this time and she would be informed. CSW is not telling patient about the acceptance until the lady is here to pick her up on Friday. CSW offered supportive encouragement for patient.  Genice Rouge [id number] ED CSW 07/29/2016 3:31 PM

## 2016-07-29 NOTE — ED Notes (Signed)
Attempted to give 0800 med. Patient stated,  "I ain't taking no medicine before I get my breakfast." patient tray taken to the room and patient was still covered up even when writer told her that the tray was at the bedside.

## 2016-07-29 NOTE — Progress Notes (Addendum)
CSW spoke with Eliot Ford at [facility] and informed him of patient's placement. CSW informed him to be on standby in case this placement does not follow through for patient. He stated to call anytime if needed.   CSW returned call to Elzie Rings, Social Worker with [facility] ED to obtain assistance with placements in their area. Message left with name and contact for return call.  CSW spoke with Lucendia Herrlich, Care Coordinator to inform him that patient has a placement acceptance. CSW asked if he would continue the process to obtain extra funding for patient for other placements if needed. He stated he would continue with the process and speak with his supervisor. CSW also informed him that his notes were not received yesterday by fax, which were requested after staffing patient's case in the LOS meeting. He stated he would have to obtain clearance from his supervisor to send any notes. CSW asked for a call back on today.    Genice Rouge [id number] ED CSW 07/29/2016 10:53 AM

## 2016-07-29 NOTE — ED Notes (Signed)
Pt. Refused to allow me to check vital signs

## 2016-07-29 NOTE — Progress Notes (Addendum)
8:05am- CSW spoke with Suanne Marker, Administrator at [facility] [phone] for follow-up. She stated she would be able to accept patient on Friday, August 11th, no pick up time currently specified. She stated her facility is all women. She stated she would need a new FL2 and chest x-ray OR tb skin test. Nurse, Charge Nurse, ED CM, and NP updated. Psychiatrist was already aware of placement acceptance.   Address: [address] [place], [place] [address] Suanne Marker, Administrator [phone]  Updated FL2 and chest x-ray completed. Staffed with EDP to ensure medications were correct on FL2 before signature. Faxed FL2 and chest x-ray information to Suanne Marker, fax# [phone].  Information resent by fax to [phone], as Mrs. Ardelle Lesches stated this is the correct fax number.    Genice Rouge [id number] ED CSW 07/29/2016 10:38 AM

## 2016-07-30 ENCOUNTER — Encounter (HOSPITAL_COMMUNITY): Payer: Self-pay | Admitting: Nurse Practitioner

## 2016-07-30 NOTE — Progress Notes (Signed)
ED CM called various medicaid providers listed from March 2017 medicaid provider list to see who is accepting new medicaid pts in [place], [place], [place]) Confirmed with Myriam Jacobson that [facility], Abigail Butts confirms Dr Eula Fried and Dr Legrand Rams has availability to see new medicaid patients LVM for [facility] [phone] to return a call to CM at [facility] mobile number

## 2016-07-30 NOTE — ED Notes (Signed)
Gave Patient [place]affe[place] free cola

## 2016-07-30 NOTE — Progress Notes (Signed)
8:56am- CSW spoke with Suanne Marker, Administrator with [facility]. She stated she received the faxed information. She asked if the FL2 could be signed with a hand written signature, as she states the pharmacy she uses for prescription refills will not accept FL2 that are electronically signed. FL2 signed by the EDP and faxed to [phone]. She stated she should arrive tomorrow around 10:30am to pick up patient.    Genice Rouge [id number] ED CSW 07/30/2016 9:33 AM

## 2016-07-30 NOTE — ED Notes (Signed)
Call received from ED nurse for possible [facility] placement Chart reviewed

## 2016-07-30 NOTE — ED Notes (Signed)
Gave patient a Coke

## 2016-07-30 NOTE — Progress Notes (Signed)
Pt came to Nursing station asking to speak with ED CM  Cm spoke with pt to tell her she would be picked up at 1030 by Tressa Busman Staff Pt mumbled something not understood but smiled, asked ED unit secretary for food and returned to her room

## 2016-07-30 NOTE — ED Notes (Addendum)
Pt awake and walking without difficulty pt having to be redirected back into room. Pt requesting new scrubs and linen change on bed. PT at baseline per offgoing staff.

## 2016-07-30 NOTE — Progress Notes (Signed)
ED CM faxed new medicaid pt referral to [facility] staff at [phone] - office # [phone] with fax confirmation along with face sheet, EDP note and copy of MAR- Placed copy in pt chart to be scanned

## 2016-07-30 NOTE — Progress Notes (Signed)
entered in d/c instructions [facility] [place]     [address] [place]    Next Steps: Schedule an appointment as soon as possible for a visit on 07/31/2016  Instructions: While you were at [place] Medicaid case worker, Carl Best to get assit with finding this medicaid accepting family doctor for you. A new patient application was completed and faxed for you to this office Please call office

## 2016-07-30 NOTE — ED Notes (Signed)
Patient informed that she will be leaving at 10:30 tomorrow morning by case management.

## 2016-07-30 NOTE — Progress Notes (Signed)
Dottie returned a call back to ED Cm and assisted to add [facility] [address] [place] in [place] for pt to get an appt and start being seen at this dr office  No recommendations for Infectious disease providers in the area

## 2016-07-31 NOTE — Progress Notes (Signed)
ED CM called [facility] to check on the status of this pt's new medicaid pcp response  Spoke with Anderson Malta at [phone] prompt 1 who states they received pt faxed information, have 5 providers out of the office today and anticipates [facility]

## 2016-07-31 NOTE — Progress Notes (Addendum)
[facility] [address] [place], [place], [place] [address] [phone] as of 07/29/16 and faxed it on 07/30/16 to include address for pt new facility as  [address] [place],  [address] Suanne Marker, Administrator [phone]

## 2016-07-31 NOTE — Progress Notes (Signed)
Entered in d/c instructions [facility] [place]     [address] [place]  This office is considering you as a new medicaid patient  Please call the office Monday 08/06/16 to make an appointment    Next Steps: Schedule an appointment as soon as possible for a visit on 08/03/2016  Instructions: While you were at [place] Medicaid case worker, Carl Best assisted with finding this medicaid accepting family doctor for you. A new patient application was completed and faxed for you to this office. Call office 08/03/16  Your [place] [employer] Social services case worker is Idelle Crouch [phone]    Next Steps: Call on 08/03/2016  Instructions: as needed for any questions or concerns with medical providers or services

## 2016-07-31 NOTE — ED Notes (Signed)
Bed: [id number] Expected date:  Expected time:  Means of arrival:  Comments: HOLD FOR RM 33

## 2016-07-31 NOTE — Progress Notes (Signed)
8:00am- CSW spoke with Suanne Marker and she states she will arrive closer to 11:00am to pick up patient this morning.   Genice Rouge [id number] ED CSW 07/31/2016 8:20 AM

## 2016-07-31 NOTE — ED Notes (Signed)
Patient ambulating back and forth to bathroom.  Patient took a shower and washed her face.

## 2016-07-31 NOTE — ED Notes (Signed)
Chaplain consulted by nursing.  Pt discharging in need of clothing.    Provided clothing from [place].   Pants, shirt, jacket, undergarments.    [place], [place]

## 2016-11-11 ENCOUNTER — Other Ambulatory Visit (HOSPITAL_COMMUNITY): Payer: Self-pay | Admitting: Adult Health

## 2016-11-11 DIAGNOSIS — Z1231 Encounter for screening mammogram for malignant neoplasm of breast: Secondary | ICD-10-CM

## 2016-12-07 ENCOUNTER — Ambulatory Visit (HOSPITAL_COMMUNITY): Payer: Self-pay

## 2017-01-04 ENCOUNTER — Ambulatory Visit (HOSPITAL_COMMUNITY): Payer: Self-pay

## 2017-01-07 ENCOUNTER — Inpatient Hospital Stay (HOSPITAL_COMMUNITY): Admission: RE | Admit: 2017-01-07 | Payer: Self-pay | Source: Ambulatory Visit

## 2017-05-28 ENCOUNTER — Encounter (HOSPITAL_COMMUNITY): Payer: Self-pay

## 2017-05-28 ENCOUNTER — Emergency Department (HOSPITAL_COMMUNITY)
Admission: EM | Admit: 2017-05-28 | Discharge: 2017-06-03 | Disposition: A | Discharge status: Home / Self Care | Payer: Medicaid Other | Attending: Emergency Medicine | Admitting: Emergency Medicine

## 2017-05-28 DIAGNOSIS — R569 Unspecified convulsions: Secondary | ICD-10-CM

## 2017-05-28 DIAGNOSIS — I10 Essential (primary) hypertension: Secondary | ICD-10-CM | POA: Diagnosis not present

## 2017-05-28 DIAGNOSIS — G40909 Epilepsy, unspecified, not intractable, without status epilepticus: Secondary | ICD-10-CM | POA: Diagnosis not present

## 2017-05-28 DIAGNOSIS — R440 Auditory hallucinations: Secondary | ICD-10-CM

## 2017-05-28 DIAGNOSIS — R45851 Suicidal ideations: Secondary | ICD-10-CM | POA: Insufficient documentation

## 2017-05-28 DIAGNOSIS — Z79899 Other long term (current) drug therapy: Secondary | ICD-10-CM | POA: Insufficient documentation

## 2017-05-28 DIAGNOSIS — F17210 Nicotine dependence, cigarettes, uncomplicated: Secondary | ICD-10-CM | POA: Insufficient documentation

## 2017-05-28 DIAGNOSIS — E876 Hypokalemia: Secondary | ICD-10-CM | POA: Diagnosis not present

## 2017-05-28 DIAGNOSIS — F259 Schizoaffective disorder, unspecified: Secondary | ICD-10-CM | POA: Diagnosis not present

## 2017-05-28 LAB — CBC WITH DIFFERENTIAL/PLATELET
BASOS ABS: 0 10*3/uL (ref 0.0–0.1)
Basophils Relative: 0 %
EOS PCT: 3 %
Eosinophils Absolute: 0.1 10*3/uL (ref 0.0–0.7)
HCT: 36.5 % (ref 36.0–46.0)
Hemoglobin: 12.4 g/dL (ref 12.0–15.0)
LYMPHS ABS: 2 10*3/uL (ref 0.7–4.0)
LYMPHS PCT: 43 %
MCH: 32.7 pg (ref 26.0–34.0)
MCHC: 34 g/dL (ref 30.0–36.0)
MCV: 96.3 fL (ref 78.0–100.0)
MONOS PCT: 10 %
Monocytes Absolute: 0.5 10*3/uL (ref 0.1–1.0)
Neutro Abs: 2 10*3/uL (ref 1.7–7.7)
Neutrophils Relative %: 44 %
PLATELETS: 137 10*3/uL — AB (ref 150–400)
RBC: 3.79 MIL/uL — ABNORMAL LOW (ref 3.87–5.11)
RDW: 15.2 % (ref 11.5–15.5)
WBC: 4.6 10*3/uL (ref 4.0–10.5)

## 2017-05-28 LAB — COMPREHENSIVE METABOLIC PANEL
ALT: 27 U/L (ref 14–54)
AST: 27 U/L (ref 15–41)
Albumin: 3.4 g/dL — ABNORMAL LOW (ref 3.5–5.0)
Alkaline Phosphatase: 103 U/L (ref 38–126)
Anion gap: 10 (ref 5–15)
BUN: 52 mg/dL — ABNORMAL HIGH (ref 6–20)
CHLORIDE: 104 mmol/L (ref 101–111)
CO2: 28 mmol/L (ref 22–32)
Calcium: 9.6 mg/dL (ref 8.9–10.3)
Creatinine, Ser: 2.31 mg/dL — ABNORMAL HIGH (ref 0.44–1.00)
GFR calc Af Amer: 26 mL/min — ABNORMAL LOW (ref >60)
GFR, EST NON AFRICAN AMERICAN: 22 mL/min — AB (ref >60)
Glucose, Bld: 95 mg/dL (ref 65–99)
Potassium: 2.8 mmol/L — ABNORMAL LOW (ref 3.5–5.1)
Sodium: 142 mmol/L (ref 135–145)
Total Bilirubin: 0.9 mg/dL (ref 0.3–1.2)
Total Protein: 8.6 g/dL — ABNORMAL HIGH (ref 6.5–8.1)

## 2017-05-28 LAB — ETHANOL

## 2017-05-28 NOTE — ED Notes (Signed)
Pt states she has been hearing voices more over the past month. Pt says the voices are being loud. Pt was just seen at [facility] for the same.

## 2017-05-28 NOTE — ED Triage Notes (Signed)
Pt states she has been hearing voices over the past several days and the voices are getting louder.  Pt denies that the voices are telling her to harm herself or anyone else.

## 2017-05-29 LAB — RAPID URINE DRUG SCREEN, HOSP PERFORMED
Amphetamines: NOT DETECTED
Barbiturates: NOT DETECTED
Benzodiazepines: NOT DETECTED
COCAINE: NOT DETECTED
OPIATES: NOT DETECTED
Tetrahydrocannabinol: NOT DETECTED

## 2017-05-29 LAB — HCG, QUANTITATIVE, PREGNANCY: hCG, Beta Chain, Quant, S: 2 m[IU]/mL (ref <5)

## 2017-05-29 MED ORDER — OLANZAPINE 5 MG PO TABS
5.0000 mg | ORAL_TABLET | Freq: Every day | ORAL | Status: DC
  Administered 2017-05-29 – 2017-06-02 (×5): 5 mg via ORAL
  Filled 2017-05-29 (×6): qty 1

## 2017-05-29 MED ORDER — HYDROCHLOROTHIAZIDE 25 MG PO TABS
25.0000 mg | ORAL_TABLET | Freq: Every day | ORAL | Status: DC
  Administered 2017-05-29 – 2017-06-03 (×6): 25 mg via ORAL
  Filled 2017-05-29 (×6): qty 1

## 2017-05-29 MED ORDER — LORAZEPAM 1 MG PO TABS
1.0000 mg | ORAL_TABLET | ORAL | Status: DC
Start: 2017-05-29 — End: 2017-06-03
  Administered 2017-06-01 – 2017-06-02 (×2): 1 mg via ORAL
  Filled 2017-05-29 (×2): qty 1

## 2017-05-29 MED ORDER — HALOPERIDOL 5 MG PO TABS
5.0000 mg | ORAL_TABLET | Freq: Every day | ORAL | Status: DC
  Administered 2017-05-29 – 2017-06-03 (×6): 5 mg via ORAL
  Filled 2017-05-29 (×6): qty 1

## 2017-05-29 MED ORDER — ALBUTEROL SULFATE HFA 108 (90 BASE) MCG/ACT IN AERS
2.0000 | INHALATION_SPRAY | Freq: Four times a day (QID) | RESPIRATORY_TRACT | Status: DC | PRN
  Administered 2017-05-30 – 2017-06-03 (×4): 2 via RESPIRATORY_TRACT
  Filled 2017-05-29 (×2): qty 6.7

## 2017-05-29 MED ORDER — ATAZANAVIR SULFATE 200 MG PO CAPS
400.0000 mg | ORAL_CAPSULE | Freq: Every day | ORAL | Status: DC
  Administered 2017-05-29 – 2017-06-03 (×6): 400 mg via ORAL
  Filled 2017-05-29 (×8): qty 2

## 2017-05-29 MED ORDER — GABAPENTIN 100 MG PO CAPS
100.0000 mg | ORAL_CAPSULE | Freq: Three times a day (TID) | ORAL | Status: DC
  Administered 2017-05-29 – 2017-06-03 (×15): 100 mg via ORAL
  Filled 2017-05-29 (×15): qty 1

## 2017-05-29 MED ORDER — LATANOPROST 0.005 % OP SOLN
1.0000 [drp] | Freq: Every day | OPHTHALMIC | Status: DC
  Administered 2017-05-29 – 2017-06-02 (×4): 1 [drp] via OPHTHALMIC
  Filled 2017-05-29: qty 2.5

## 2017-05-29 MED ORDER — ONDANSETRON HCL 4 MG PO TABS: 4.0000 mg | ORAL_TABLET | Freq: Three times a day (TID) | ORAL | Status: DC | PRN

## 2017-05-29 MED ORDER — HYDROXYZINE HCL 25 MG PO TABS
25.0000 mg | ORAL_TABLET | Freq: Three times a day (TID) | ORAL | Status: DC | PRN
  Administered 2017-05-30 – 2017-06-01 (×2): 25 mg via ORAL
  Filled 2017-05-29 (×3): qty 1

## 2017-05-29 MED ORDER — VITAMIN B-6 50 MG PO TABS
100.0000 mg | ORAL_TABLET | Freq: Every day | ORAL | Status: DC
  Administered 2017-05-29 – 2017-06-03 (×6): 100 mg via ORAL
  Filled 2017-05-29: qty 2
  Filled 2017-05-29: qty 1
  Filled 2017-05-29 (×3): qty 2
  Filled 2017-05-29 (×2): qty 1

## 2017-05-29 MED ORDER — VITAMIN B-1 100 MG PO TABS
50.0000 mg | ORAL_TABLET | Freq: Every day | ORAL | Status: DC
  Administered 2017-05-29 – 2017-06-03 (×6): 50 mg via ORAL
  Filled 2017-05-29 (×6): qty 1

## 2017-05-29 MED ORDER — LISINOPRIL 10 MG PO TABS
40.0000 mg | ORAL_TABLET | Freq: Every day | ORAL | Status: DC
  Administered 2017-05-29 – 2017-06-03 (×6): 40 mg via ORAL
  Filled 2017-05-29 (×6): qty 4

## 2017-05-29 MED ORDER — ACETAMINOPHEN 325 MG PO TABS
650.0000 mg | ORAL_TABLET | ORAL | Status: DC | PRN
  Administered 2017-05-30: 650 mg via ORAL
  Filled 2017-05-29: qty 2

## 2017-05-29 MED ORDER — NICOTINE 21 MG/24HR TD PT24
21.0000 mg | MEDICATED_PATCH | Freq: Every day | TRANSDERMAL | Status: DC
  Administered 2017-05-29 – 2017-06-03 (×6): 21 mg via TRANSDERMAL
  Filled 2017-05-29 (×6): qty 1

## 2017-05-29 MED ORDER — ABACAVIR SULFATE 300 MG PO TABS
600.0000 mg | ORAL_TABLET | Freq: Every day | ORAL | Status: DC
  Administered 2017-05-29 – 2017-06-03 (×6): 600 mg via ORAL
  Filled 2017-05-29 (×7): qty 2

## 2017-05-29 MED ORDER — LAMIVUDINE 150 MG PO TABS
300.0000 mg | ORAL_TABLET | Freq: Every day | ORAL | Status: DC
  Administered 2017-05-29 – 2017-06-03 (×6): 300 mg via ORAL
  Filled 2017-05-29 (×7): qty 2

## 2017-05-29 MED ORDER — POTASSIUM CHLORIDE CRYS ER 20 MEQ PO TBCR
40.0000 meq | EXTENDED_RELEASE_TABLET | Freq: Three times a day (TID) | ORAL | Status: DC
  Administered 2017-05-29 – 2017-06-03 (×16): 40 meq via ORAL
  Filled 2017-05-29 (×16): qty 2

## 2017-05-29 MED ORDER — METOPROLOL SUCCINATE ER 25 MG PO TB24
25.0000 mg | ORAL_TABLET | Freq: Every day | ORAL | Status: DC
  Administered 2017-05-29 – 2017-06-03 (×6): 25 mg via ORAL
  Filled 2017-05-29 (×7): qty 1

## 2017-05-29 MED ORDER — AMANTADINE HCL 100 MG PO CAPS
100.0000 mg | ORAL_CAPSULE | Freq: Two times a day (BID) | ORAL | Status: DC
  Administered 2017-05-29 – 2017-06-03 (×11): 100 mg via ORAL
  Filled 2017-05-29 (×13): qty 1

## 2017-05-29 MED ORDER — LORATADINE 10 MG PO TABS
10.0000 mg | ORAL_TABLET | Freq: Every day | ORAL | Status: DC
  Administered 2017-05-29 – 2017-06-03 (×6): 10 mg via ORAL
  Filled 2017-05-29 (×6): qty 1

## 2017-05-29 MED ORDER — ISONIAZID 300 MG PO TABS
300.0000 mg | ORAL_TABLET | Freq: Every day | ORAL | Status: DC
Start: 2017-05-29 — End: 2017-06-03
  Administered 2017-05-29 – 2017-06-03 (×6): 300 mg via ORAL
  Filled 2017-05-29 (×7): qty 1

## 2017-05-29 NOTE — ED Notes (Signed)
Pt requests phone to call sister.
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
## 2017-05-29 NOTE — ED Notes (Signed)
Dinner tray provided

## 2017-05-29 NOTE — ED Notes (Signed)
Pt provided 2 packs of peanut butter crackers & something to drink. Pt resting in the bed w/ no other needs voiced.

## 2017-05-29 NOTE — ED Notes (Signed)
Pt given lotion for dry skin.
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
## 2017-05-29 NOTE — ED Notes (Signed)
Pt resting at this time.

## 2017-05-29 NOTE — ED Notes (Signed)
Per Dr Tomi Bamberger pt told her voices were telling her to walk into traffic.

## 2017-05-29 NOTE — ED Notes (Signed)
Pt incontinent of urine. Pt assisted to restroom to clean up & change into paper scrubs. Linens changed on the bed.

## 2017-05-29 NOTE — ED Notes (Signed)
All items removed from walls & locked in cabinets. Pt belongings placed in locker & labeled. Pt ambulates w/ cane left in room.

## 2017-05-29 NOTE — ED Notes (Signed)
Pt escorted to the shower by security and sitter.

## 2017-05-30 ENCOUNTER — Emergency Department (HOSPITAL_COMMUNITY): Payer: Medicaid Other

## 2017-05-30 LAB — I-STAT CHEM 8, ED
BUN: 51 mg/dL — ABNORMAL HIGH (ref 6–20)
CHLORIDE: 106 mmol/L (ref 101–111)
Calcium, Ion: 1.1 mmol/L — ABNORMAL LOW (ref 1.15–1.40)
Creatinine, Ser: 2.5 mg/dL — ABNORMAL HIGH (ref 0.44–1.00)
Glucose, Bld: 122 mg/dL — ABNORMAL HIGH (ref 65–99)
HEMATOCRIT: 41 % (ref 36.0–46.0)
HEMOGLOBIN: 13.9 g/dL (ref 12.0–15.0)
POTASSIUM: 4.3 mmol/L (ref 3.5–5.1)
SODIUM: 140 mmol/L (ref 135–145)
TCO2: 20 mmol/L (ref 0–100)

## 2017-05-30 MED ORDER — ACETAMINOPHEN 500 MG PO TABS
1000.0000 mg | ORAL_TABLET | Freq: Once | ORAL | Status: AC
  Administered 2017-05-30: 1000 mg via ORAL
  Filled 2017-05-30: qty 2

## 2017-05-30 MED ORDER — LEVETIRACETAM IN NACL 1000 MG/100ML IV SOLN
1000.0000 mg | Freq: Once | INTRAVENOUS | Status: AC
  Administered 2017-05-30: 1000 mg via INTRAVENOUS
  Filled 2017-05-30: qty 100

## 2017-05-30 MED ORDER — LEVETIRACETAM 500 MG PO TABS
500.0000 mg | ORAL_TABLET | Freq: Two times a day (BID) | ORAL | Status: DC
  Administered 2017-05-30 – 2017-06-03 (×8): 500 mg via ORAL
  Filled 2017-05-30 (×9): qty 1

## 2017-05-30 MED ORDER — SODIUM CHLORIDE 0.9 % IV BOLUS (SEPSIS)
1000.0000 mL | Freq: Once | INTRAVENOUS | Status: AC
  Administered 2017-05-30: 1000 mL via INTRAVENOUS

## 2017-05-30 NOTE — ED Notes (Signed)
Pt walking to shower with tech and security with cane- fell to floor striking head- staff immediately responded as did Dr Dolly Rias- pt placed on stretcher, IV established, VS obtained  Pt has Hematoma to her R forehead and ice applied  Pt returned to room with call to CT who wil. Get her next

## 2017-05-30 NOTE — ED Notes (Signed)
"   the reason I'm hearing voices is because they started an IV on me....ibuprofen live with four people, I hear the pipes in the plumbing and I don't like that"..  Pt appears to be responding to internal stimuli, as evidenced by her looking to the corner and nodding her head, flight of ideas as well as sitting in the corner of the room

## 2017-05-30 NOTE — ED Notes (Signed)
Out of bed to bathroom where sitter and RN had to intervene for patient was washing her face repeatedly and splashing water on the floor- repeated request for pt to return to her room were rebuffed and when pt heard that we were planning to call security, returned to her room with the statement"I don't like how you treat the mentally ill here". Reassured that staff did not wish for her to fall again   Returned to room, pt is currently asleep

## 2017-05-30 NOTE — ED Notes (Signed)
Ice cream offered-

## 2017-05-30 NOTE — ED Notes (Signed)
From CT stretch with seizure pads applied

## 2017-05-30 NOTE — ED Notes (Signed)
Meal provided 

## 2017-05-30 NOTE — ED Notes (Signed)
Pt reports that she has no history of seizures, does not know how she fell

## 2017-05-30 NOTE — ED Notes (Signed)
Meal provided with over 90% eaten=- pt is clearer of thought, requesting phone

## 2017-05-30 NOTE — ED Provider Notes (Addendum)
Patient in normal state of health until she needed to take a shower. Was walking down the hall, I heard a grunt and then heard her hit her head on the ground and patient was having what appeared to be tonic clonic seizure. Airway intact. Lasted approximately 10 seconds and slightly postictal afterwards with weakness. Hematoma above right eye. otherwise not able to complete neurologic exam at this time secondary to somnolence. Will ct head/neck, give keppra. Needs K/Na rechecked. If these are ok and patient returns to baseline MS, would consider keeping on Keppra and having outpatient neurologic follow up.    Shanena Pellegrino, Corene Cornea, MD 05/30/17 1356  Patient is alert, oriented, still hallucinating. Hematoma improved. Ct head normal. Ct c spien normal. Labs improved, aside from mild rise in creatinine.  Has had keppra. Will continue same and recommend outpatient neurology follow up.   Medically stable for continued psychiatric workup at this time.    Labs Reviewed  COMPREHENSIVE METABOLIC PANEL - Abnormal; Notable for the following:       Result Value   Potassium 2.8 (*)    BUN 52 (*)    Creatinine, Ser 2.31 (*)    Total Protein 8.6 (*)    Albumin 3.4 (*)    GFR calc non Af Amer 22 (*)    GFR calc Af Amer 26 (*)    All other components within normal limits  CBC WITH DIFFERENTIAL/PLATELET - Abnormal; Notable for the following:    RBC 3.79 (*)    Platelets 137 (*)    All other components within normal limits  I-STAT CHEM 8, ED - Abnormal; Notable for the following:    BUN 51 (*)    Creatinine, Ser 2.50 (*)    Glucose, Bld 122 (*)    Calcium, Ion 1.10 (*)    All other components within normal limits  ETHANOL  RAPID URINE DRUG SCREEN, HOSP PERFORMED  HCG, QUANTITATIVE, PREGNANCY     Cloyce Blankenhorn, Corene Cornea, MD 05/30/17 [phone]

## 2017-05-30 NOTE — ED Notes (Signed)
Pt has had multiple request to use the phone- per sitter, pt is calling group home whose residents/staff are frustrated with her calls-  Pt is told she might use the phone 3 time daily only

## 2017-05-30 NOTE — BH Assessment (Signed)
[facility] currently at capacity. Faxed clinical information to the following facilities for placement:  [facility]   [address] [place], [place], [facility], [facility] Triage Specialist [phone]

## 2017-05-30 NOTE — ED Notes (Signed)
To CT via stretcher

## 2017-05-31 NOTE — ED Notes (Signed)
Tech assisting pt with bedside bath, changing bed and clothes

## 2017-05-31 NOTE — ED Notes (Signed)
Pt up out of bed wanting bedpan, states she can not walk to bathroom, pt put bedpan on the chair then voided in floor. Sitter assisting pt.

## 2017-05-31 NOTE — ED Notes (Signed)
Pt given snack and offer bathroom.

## 2017-05-31 NOTE — ED Notes (Signed)
Pt yelling, can be heard out at nursing desk, requesting to see a nurse, Legrand Como RN went into room to speak with pt, comfort measures provided, sitter remains at bedside,

## 2017-05-31 NOTE — Progress Notes (Signed)
Pt meets criteria for inpatient admission; CSW faxed referral packet to the following facilities in attempts to secure inpatient bed:   [facility]  TTS will continue to seek placement.   Adriana Reams, LCSW Clinical Social Work [phone]

## 2017-05-31 NOTE — ED Notes (Signed)
Pt assisted to restroom by sitter, tolerated well,

## 2017-05-31 NOTE — ED Notes (Signed)
Group home staff from [facility] can be reached at [phone] today until 5pm if needed.

## 2017-05-31 NOTE — ED Notes (Signed)
Pt resting supine, even rise and fall of chest area, sitter remains at bedside,

## 2017-05-31 NOTE — ED Notes (Signed)
Pt c/o headache, refused offer of tylenol, requesting ice water which was given, sitter remains at bedside,

## 2017-05-31 NOTE — ED Notes (Signed)
Ask sitter to check vitals

## 2017-06-01 MED ORDER — IPRATROPIUM-ALBUTEROL 0.5-2.5 (3) MG/3ML IN SOLN
3.0000 mL | RESPIRATORY_TRACT | Status: DC | PRN
Start: 2017-06-01 — End: 2017-06-03

## 2017-06-01 NOTE — ED Notes (Signed)
Pt requesting inhaler at this time for wheezing and cough. Lung sounds clear, dry cough. EDP notified. RT called for inhaler

## 2017-06-01 NOTE — ED Provider Notes (Signed)
Resting comfortably in bed. Alert, awake. Denies any complaint.   Orlie Dakin, MD 06/01/17 [phone]

## 2017-06-01 NOTE — BH Assessment (Signed)
Pt pleasant and oriented to self, date and situation. She says she slept good and ate all of her breakfast. Pt is tangential at times. She appears childlike as she giggles and tells Probation officer she thought she woke up in the "wrong apartment this am". Pt asks about disposition plans and writer updates her that TTS continues to seek inpatient placement. Zerita Boers NP recommends inpatient placement.   Arnold Long, [place] [facility]rapeutic Triage Specialist

## 2017-06-02 MED ORDER — VITAMIN B-1 100 MG PO TABS
ORAL_TABLET | ORAL | Status: AC
  Administered 2017-06-02: 100 mg
  Filled 2017-06-02: qty 1

## 2017-06-02 MED ORDER — POTASSIUM CHLORIDE CRYS ER 10 MEQ PO TBCR
EXTENDED_RELEASE_TABLET | ORAL | Status: AC
  Filled 2017-06-02: qty 4

## 2017-06-02 NOTE — ED Notes (Signed)
Pt repeatedly getting out of bed and walking into hallway asking for phone. Pt made aware she has had her 3 phone calls today per policy. Pt getting upset stating "I've only had 2", sitter confirms 3 phone calls as well as previous Therapist, sports. Instructed to go back into room, chaplin called as pt requested.

## 2017-06-02 NOTE — ED Notes (Signed)
Pt sleeping no distress noted at this time

## 2017-06-02 NOTE — BHH Counselor (Signed)
Pt presented to [facility] on 05/29/17 with complaint of auditory hallucination and homicidal ideation.  She lives in a group home and stated that residents and staff at the group home were trying to kill her.  Pt was reassessed this AM.  She reported that she continues to believe people at her group home are trying to harm her, and she admitted that until this morning, she experienced auditory hallucinations -- voices threatening her.  Per Dr. Dwyane Dee, Pt meets inpt criteria and should be placed.

## 2017-06-02 NOTE — Progress Notes (Signed)
Pt meets criteria for inpatient admission; CSW faxed referral packet to the following facilities in attempts to secure inpatient bed:    [facility],  [place]   TTS will continue to seek placement.   Adriana Reams, [place] Work [phone]

## 2017-06-02 NOTE — ED Notes (Addendum)
Pt sleeping, even unlabored respirations noted.

## 2017-06-02 NOTE — ED Notes (Signed)
Pt used excessive amount of toilet paper and has clogged the commode. Pt purposely urinating on self at stating "I can go take a shower now". Perineal care and bed bath provided for pt.

## 2017-06-02 NOTE — ED Notes (Signed)
Pt resting with eyes closed, appears to be in no distress. Respirations are even and unlabored. Sitter at bedside.

## 2017-06-02 NOTE — ED Notes (Signed)
Received report on pt, pt requesting to use phone, advised pt that she would have to wait until tomorrow due to it being late to call someone, pt expressed understanding, pt also had previously used phone during the day, sitter remains at bedside, comfort measure provided,

## 2017-06-02 NOTE — ED Notes (Signed)
Pt awoke due to urinary incontinence while sleeping. Bed bath and hair washing performed. Pt brushed own teeth. All linens and scrubs changed.

## 2017-06-02 NOTE — ED Notes (Signed)
Pt ambulatory to restroom at this time. 

## 2017-06-02 NOTE — ED Notes (Signed)
Pt wet the bed at this time stating "I didn't know I had to go", pt has been continent all day. Pt's clothing and linen changed. Bed bath performed. Pt brushing teeth at this time.

## 2017-06-03 DIAGNOSIS — R45851 Suicidal ideations: Secondary | ICD-10-CM | POA: Diagnosis not present

## 2017-06-03 DIAGNOSIS — R45850 Homicidal ideations: Secondary | ICD-10-CM | POA: Diagnosis not present

## 2017-06-03 DIAGNOSIS — F17210 Nicotine dependence, cigarettes, uncomplicated: Secondary | ICD-10-CM | POA: Diagnosis not present

## 2017-06-03 DIAGNOSIS — R569 Unspecified convulsions: Secondary | ICD-10-CM | POA: Diagnosis not present

## 2017-06-03 DIAGNOSIS — E876 Hypokalemia: Secondary | ICD-10-CM | POA: Diagnosis not present

## 2017-06-03 DIAGNOSIS — F259 Schizoaffective disorder, unspecified: Secondary | ICD-10-CM | POA: Diagnosis not present

## 2017-06-03 DIAGNOSIS — R440 Auditory hallucinations: Secondary | ICD-10-CM | POA: Insufficient documentation

## 2017-06-03 NOTE — ED Notes (Signed)
Pt assisted to restroom, bed linen wet, pt scrubs, bed linen changed, update given, sitter remains at bedside,

## 2017-06-03 NOTE — ED Notes (Signed)
Pt not continent of urine, scrubs and linens changed.

## 2017-06-03 NOTE — Progress Notes (Signed)
CSW spoke with patient's group home director Phineas Real, [phone]) from [facility] Measures about patient's d/c. Latoya reports she will be at the ED by 2:00pm to pick up patient.   Adriana Reams, [place] Work [phone]

## 2017-06-03 NOTE — ED Notes (Signed)
Patient given drink. 

## 2017-06-03 NOTE — ED Notes (Signed)
Patients belongings given back to patient.

## 2017-06-03 NOTE — ED Notes (Addendum)
[  pt assisted to restroom with sitter, bed linen wet and changed, pt cleaned, requesting a breathing treatment,

## 2017-06-03 NOTE — ED Provider Notes (Signed)
  Physical Exam  BP (!) 171/98 (BP Location: Right Arm)   Pulse 64   Temp 97.7 F (36.5 C) (Oral)   Resp 16   Ht 5\' 8"  (1.727 m)   Wt 98 kg (216 lb)   SpO2 100%   BMI 32.84 kg/m   Physical Exam  ED Course  Procedures  MDM Cleared by psychiatry for transfer back to group home at 2 PM.       Davonna Belling, MD 06/03/17 1044

## 2017-06-03 NOTE — ED Notes (Signed)
Visitor here to pick patient up.
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
## 2017-06-03 NOTE — Discharge Instructions (Signed)
Follow-up with your normal providers.

## 2018-06-20 IMAGING — CT CT HEAD W/O CM
5 of 7 series · 17 of 47 positions shown, 18 images · non-contrast
Comparison: None.

CLINICAL DATA: Pain following fall with apparent seizure

EXAM:
CT HEAD WITHOUT CONTRAST
CT CERVICAL SPINE WITHOUT CONTRAST
TECHNIQUE: Multidetector CT imaging of the head and cervical spine was
performed following the standard protocol without intravenous
contrast. Multiplanar CT image reconstructions of the cervical spine
were also generated.

[Series 3: head wo · axial · 0.45mm/px · z∈[+1557,+1642]mm · 3 of 35 slices shown, 4 images]
[im 9/35  brain]
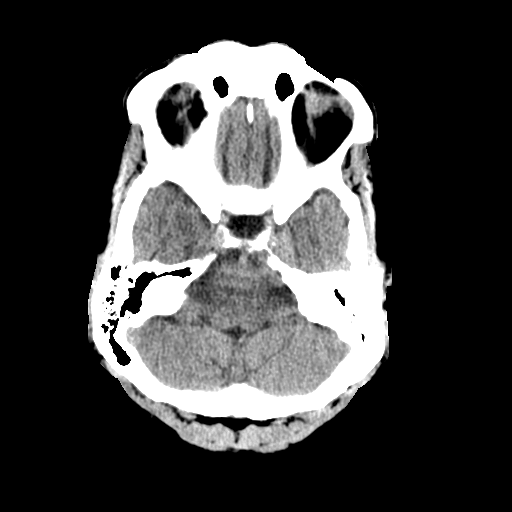
[im 9/35  bone]
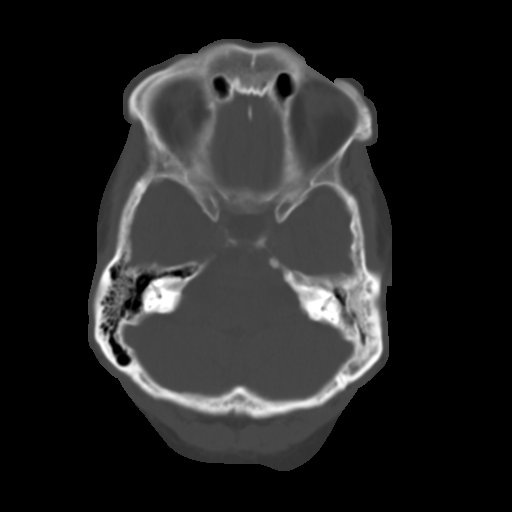
[im 18/35  brain]
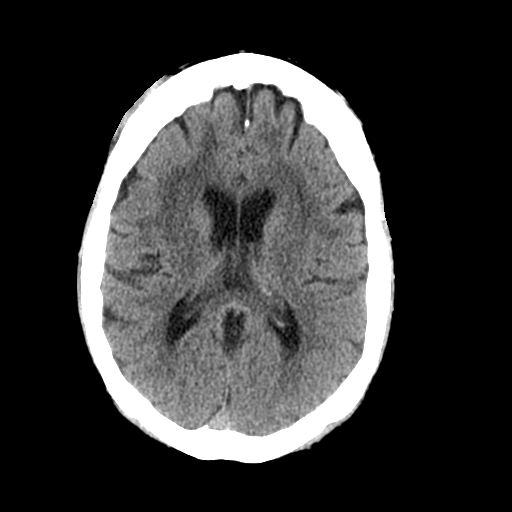
[im 26/35  brain]
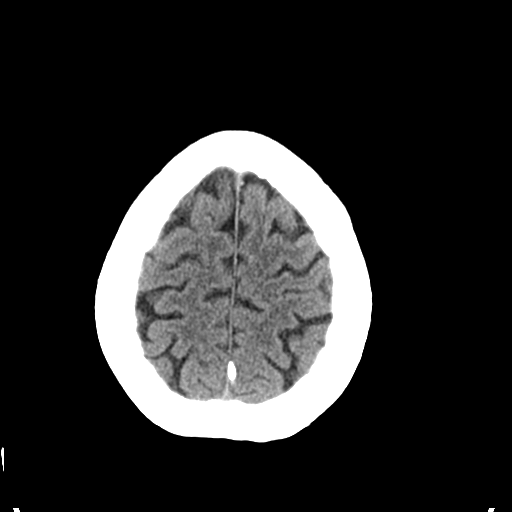

[Series 5: coronal soft tissue · coronal · 0.34mm/px · 3 of 69 slices shown]
[im 26/69  brain]
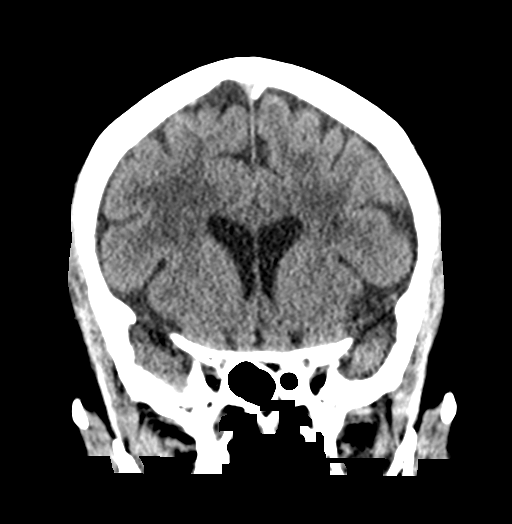
[im 35/69  brain]
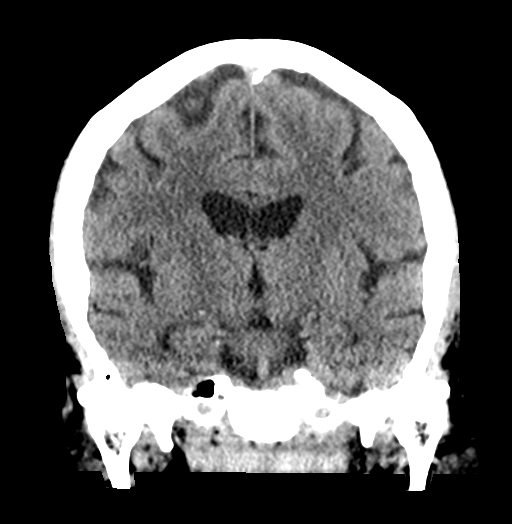
[im 43/69  brain]
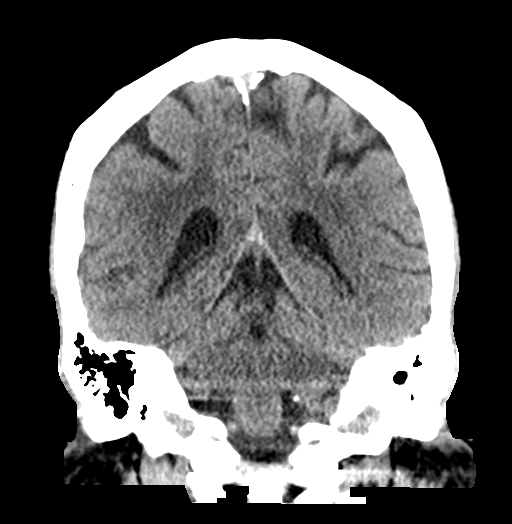

[Series 6: sagittal soft tissue · sagittal · 0.37mm/px · 1 of 54 slices shown]
[im 27/54  brain]
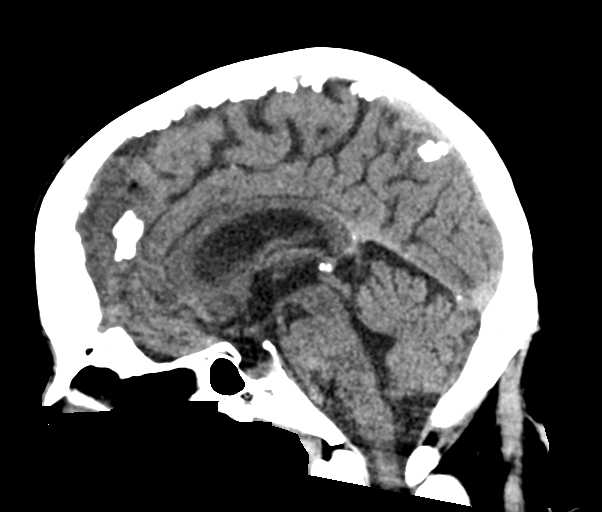

[Series 8: c spine soft · axial · 0.30mm/px · z∈[+1378,+1392]mm · 2 of 80 slices shown]
[im 8/80  brain]
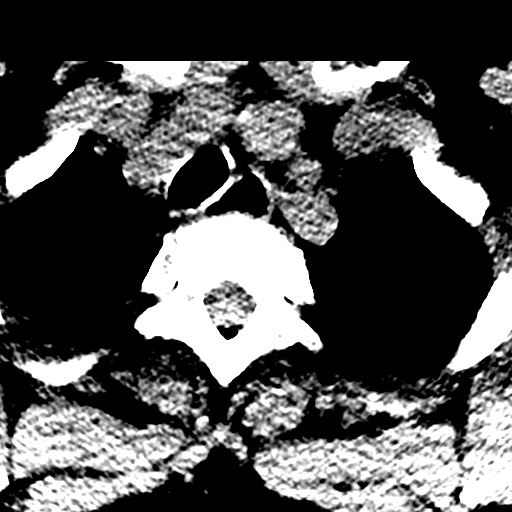
[im 15/80  brain]
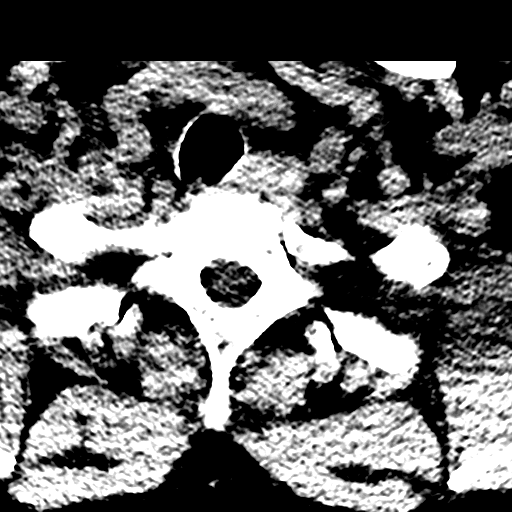

[Series 13: orthogonal bone · axial · 0.21mm/px · z∈[+1365,+1487]mm · 8 of 84 slices shown]
[im 7/84  bone]
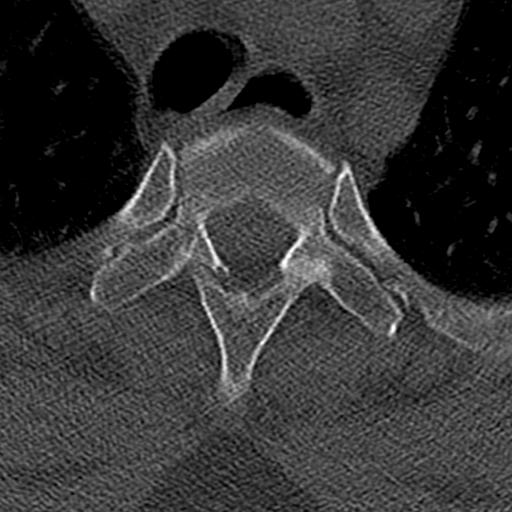
[im 21/84  bone]
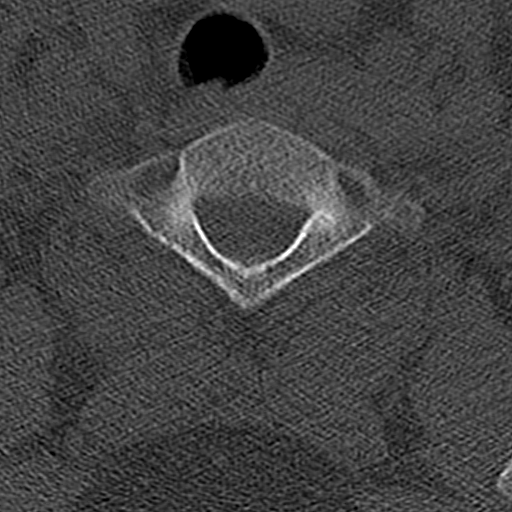
[im 28/84  bone]
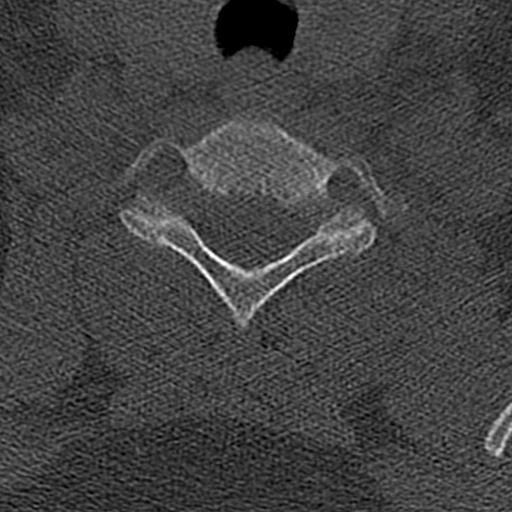
[im 35/84  bone]
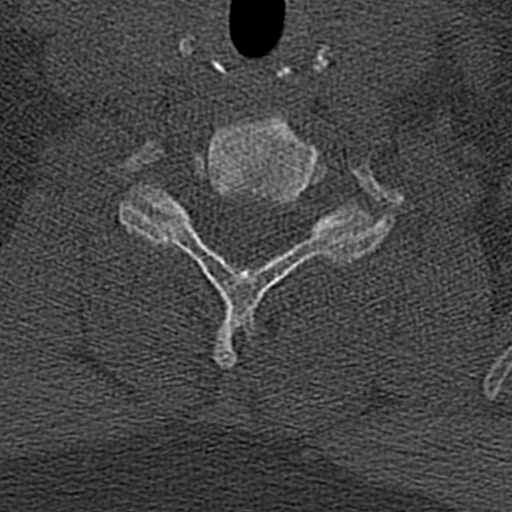
[im 49/84  bone]
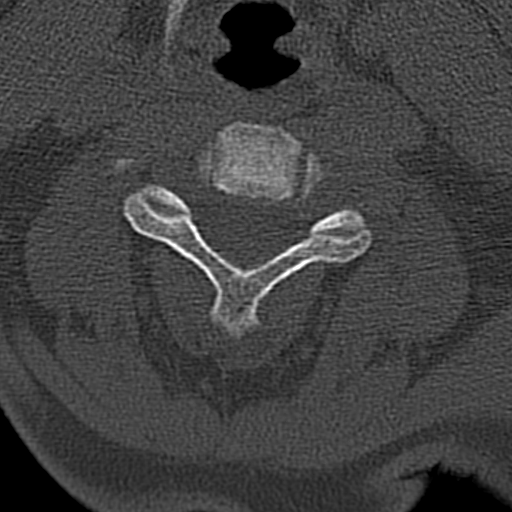
[im 56/84  bone]
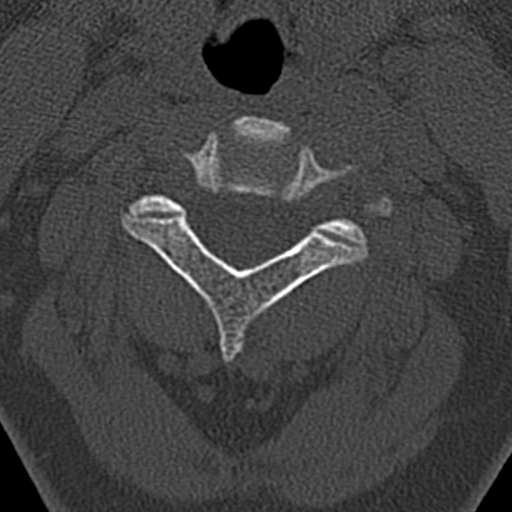
[im 63/84  bone]
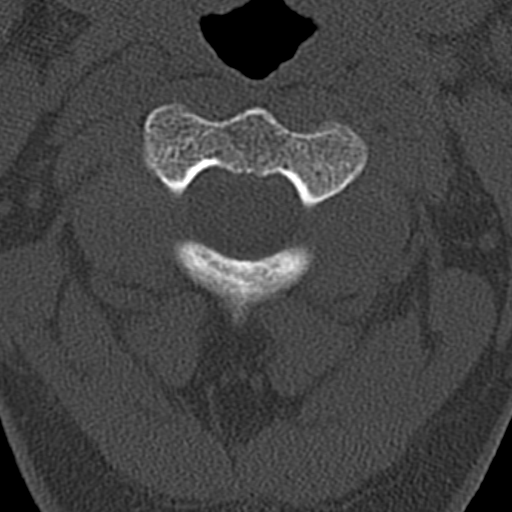
[im 77/84  bone]
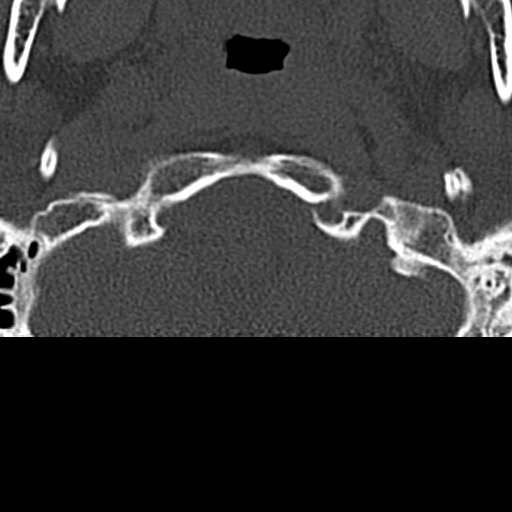

[17 of 47 positions shown; findings below may reference images not displayed]

FINDINGS: CT HEAD FINDINGS

Brain: The ventricles are normal in size and configuration. There is
no intracranial mass, hemorrhage, extra-axial fluid collection, or
midline shift. There is patchy small vessel disease in the centra
semiovale bilaterally. Elsewhere gray-white compartments appear
normal. No acute infarct evident.

Vascular: No hyperdense vessel. There are foci of calcification in
each carotid siphon region.

Skull: There is frontal hyperostosis bilaterally. Bony calvarium
appears intact. There is a right frontal scalp hematoma.

Sinuses/Orbits: There is a defect in the medial left orbital wall
which contains fat but no disruption of the extra-ocular muscles.
Orbits otherwise appear symmetric bilaterally. There is mucosal
thickening in the right maxillary antrum with a much lesser degree
of mucosal thickening in the left maxillary antrum. There is mild
mucosal thickening in several ethmoid air cells bilaterally. Frontal
sinuses are hypoplastic.

Other: Mastoids on the right are clear. There is opacification of
multiple mastoid air cells on the left.

CT CERVICAL SPINE FINDINGS

Alignment: There is no spondylolisthesis.

Skull base and vertebrae: Skull base and craniocervical junction
regions appear normal. No fracture evident. No blastic or lytic bone
lesions.

Soft tissues and spinal canal: Prevertebral soft tissues and
predental space regions are normal. There is no paraspinous lesion.
There is no evident cord or canal hematoma.

Disc levels: Disk spaces appear normal. No disc extrusion or
stenosis. No nerve root edema or effacement. There is slight facet
hypertrophy at several levels.

Upper chest: Visualized upper lung zones are clear.

Other: There is calcification in the right carotid artery.
IMPRESSION: CT head: Patchy periventricular small vessel disease. No
intracranial mass, hemorrhage, or extra-axial fluid collection. No
evidence of acute infarct.

Right frontal scalp hematoma without underlying fracture.

Foci of arterial vascular calcification. Areas of paranasal sinus
disease. Defect in the medial left orbital wall, either congenital
or due to prior trauma.

Extensive mastoid disease on the left without air-fluid level
apparent.

CT cervical spine: No fracture or spondylolisthesis. There is slight
osteoarthritic change at several levels. There is calcification in
the right carotid artery.

## 2019-04-26 ENCOUNTER — Emergency Department (HOSPITAL_COMMUNITY): Payer: Medicaid Other

## 2019-04-26 ENCOUNTER — Emergency Department (HOSPITAL_COMMUNITY)
Admission: EM | Admit: 2019-04-26 | Discharge: 2019-04-26 | Disposition: A | Discharge status: Home / Self Care | Payer: Medicaid Other | Attending: Emergency Medicine | Admitting: Emergency Medicine

## 2019-04-26 ENCOUNTER — Encounter (HOSPITAL_COMMUNITY): Payer: Self-pay

## 2019-04-26 ENCOUNTER — Other Ambulatory Visit: Payer: Self-pay

## 2019-04-26 DIAGNOSIS — E119 Type 2 diabetes mellitus without complications: Secondary | ICD-10-CM | POA: Insufficient documentation

## 2019-04-26 DIAGNOSIS — F17210 Nicotine dependence, cigarettes, uncomplicated: Secondary | ICD-10-CM | POA: Insufficient documentation

## 2019-04-26 DIAGNOSIS — Z79899 Other long term (current) drug therapy: Secondary | ICD-10-CM | POA: Insufficient documentation

## 2019-04-26 DIAGNOSIS — R0602 Shortness of breath: Secondary | ICD-10-CM | POA: Diagnosis not present

## 2019-04-26 DIAGNOSIS — R072 Precordial pain: Secondary | ICD-10-CM | POA: Diagnosis not present

## 2019-04-26 DIAGNOSIS — I129 Hypertensive chronic kidney disease with stage 1 through stage 4 chronic kidney disease, or unspecified chronic kidney disease: Secondary | ICD-10-CM | POA: Diagnosis not present

## 2019-04-26 DIAGNOSIS — N184 Chronic kidney disease, stage 4 (severe): Secondary | ICD-10-CM | POA: Diagnosis not present

## 2019-04-26 DIAGNOSIS — F259 Schizoaffective disorder, unspecified: Secondary | ICD-10-CM | POA: Diagnosis not present

## 2019-04-26 DIAGNOSIS — Z1159 Encounter for screening for other viral diseases: Secondary | ICD-10-CM | POA: Insufficient documentation

## 2019-04-26 DIAGNOSIS — R079 Chest pain, unspecified: Secondary | ICD-10-CM | POA: Diagnosis present

## 2019-04-26 DIAGNOSIS — B20 Human immunodeficiency virus [HIV] disease: Secondary | ICD-10-CM | POA: Diagnosis not present

## 2019-04-26 DIAGNOSIS — F329 Major depressive disorder, single episode, unspecified: Secondary | ICD-10-CM | POA: Diagnosis not present

## 2019-04-26 HISTORY — DX: Unsteadiness on feet: R26.81

## 2019-04-26 HISTORY — DX: Type 2 diabetes mellitus without complications: E11.9

## 2019-04-26 HISTORY — DX: Polyneuropathy, unspecified: G62.9

## 2019-04-26 LAB — BASIC METABOLIC PANEL
Anion gap: 9 (ref 5–15)
BUN: 44 mg/dL — ABNORMAL HIGH (ref 6–20)
CO2: 17 mmol/L — ABNORMAL LOW (ref 22–32)
Calcium: 9 mg/dL (ref 8.9–10.3)
Chloride: 114 mmol/L — ABNORMAL HIGH (ref 98–111)
Creatinine, Ser: 2.5 mg/dL — ABNORMAL HIGH (ref 0.44–1.00)
GFR calc Af Amer: 23 mL/min — ABNORMAL LOW (ref >60)
GFR calc non Af Amer: 20 mL/min — ABNORMAL LOW (ref >60)
Glucose, Bld: 86 mg/dL (ref 70–99)
Potassium: 5.7 mmol/L — ABNORMAL HIGH (ref 3.5–5.1)
Sodium: 140 mmol/L (ref 135–145)

## 2019-04-26 LAB — CBC
HCT: 29.2 % — ABNORMAL LOW (ref 36.0–46.0)
Hemoglobin: 9.2 g/dL — ABNORMAL LOW (ref 12.0–15.0)
MCH: 35 pg — ABNORMAL HIGH (ref 26.0–34.0)
MCHC: 31.5 g/dL (ref 30.0–36.0)
MCV: 111 fL — ABNORMAL HIGH (ref 80.0–100.0)
Platelets: 175 10*3/uL (ref 150–400)
RBC: 2.63 MIL/uL — ABNORMAL LOW (ref 3.87–5.11)
RDW: 15.2 % (ref 11.5–15.5)
WBC: 4.1 10*3/uL (ref 4.0–10.5)
nRBC: 0 % (ref 0.0–0.2)

## 2019-04-26 LAB — RETICULOCYTES
Immature Retic Fract: 10.4 % (ref 2.3–15.9)
RBC.: 2.62 MIL/uL — ABNORMAL LOW (ref 3.87–5.11)
Retic Count, Absolute: 47.9 10*3/uL (ref 19.0–186.0)
Retic Ct Pct: 1.8 % (ref 0.4–3.1)

## 2019-04-26 LAB — TROPONIN I: Troponin I: 0.04 ng/mL (ref <0.03)

## 2019-04-26 LAB — FOLATE: Folate: 9.9 ng/mL (ref >5.9)

## 2019-04-26 LAB — IRON AND TIBC
Iron: 36 ug/dL (ref 28–170)
Saturation Ratios: 13 % (ref 10.4–31.8)
TIBC: 270 ug/dL (ref 250–450)
UIBC: 234 ug/dL

## 2019-04-26 LAB — VITAMIN B12: Vitamin B-12: 531 pg/mL (ref 180–914)

## 2019-04-26 LAB — FERRITIN: Ferritin: 291 ng/mL (ref 11–307)

## 2019-04-26 LAB — BRAIN NATRIURETIC PEPTIDE: B Natriuretic Peptide: 705 pg/mL — ABNORMAL HIGH (ref 0.0–100.0)

## 2019-04-26 LAB — SARS CORONAVIRUS 2 BY RT PCR (HOSPITAL ORDER, PERFORMED IN ~~LOC~~ HOSPITAL LAB): SARS Coronavirus 2: NEGATIVE

## 2019-04-26 MED ORDER — FUROSEMIDE 40 MG PO TABS: 40.0000 mg | ORAL_TABLET | Freq: Once | ORAL | Status: DC

## 2019-04-26 MED ORDER — SODIUM BICARBONATE 8.4 % IV SOLN
50.0000 meq | Freq: Once | INTRAVENOUS | Status: AC
  Administered 2019-04-26: 50 meq via INTRAVENOUS
  Filled 2019-04-26: qty 50

## 2019-04-26 MED ORDER — DEXTROSE 50 % IV SOLN
25.0000 g | Freq: Once | INTRAVENOUS | Status: AC
  Administered 2019-04-26: 25 g via INTRAVENOUS

## 2019-04-26 MED ORDER — DEXTROSE 50 % IV SOLN
INTRAVENOUS | Status: AC
  Filled 2019-04-26: qty 50

## 2019-04-26 MED ORDER — SODIUM CHLORIDE 0.9 % IV BOLUS
1000.0000 mL | Freq: Once | INTRAVENOUS | Status: AC
  Administered 2019-04-26: 1000 mL via INTRAVENOUS

## 2019-04-26 MED ORDER — SODIUM CHLORIDE 0.9% FLUSH: 3.0000 mL | Freq: Once | INTRAVENOUS | Status: DC

## 2019-04-26 NOTE — ED Notes (Signed)
CRITICAL VALUE ALERT  Critical Value:  Troponin 0.04  Date & Time Notied:  04/26/2019 1746  Provider Notified: Dr. Ashok Cordia   Orders Received/Actions taken: None yet

## 2019-04-26 NOTE — ED Notes (Signed)
Pt given food tray.

## 2019-04-26 NOTE — ED Triage Notes (Signed)
Pt brought to ED via [facility] for mid chest pain x 1 week. Pt was given ASA 324 mg by EMS. Pt also complaining of SOB, and weakness.

## 2019-04-26 NOTE — ED Notes (Signed)
Pt ambulatory off unit. Pt states she is not waiting anymore. Called facility to let them know pt waiting in waiting room.

## 2019-04-26 NOTE — ED Notes (Signed)
Patient states that she will accept blood work after speaking with Lennette Bihari, Retail buyer of facility pt lives at.

## 2019-04-26 NOTE — Discharge Instructions (Addendum)
It was our pleasure to provide your ER care today - we hope that you feel better.  Your lab tests show chronic kidney disease (creatinine 2.5), anemia (hemoglobin 9.2), and mildly elevated potassium (5.7) - follow up with primary care doctor in the next 1-2 days for recheck - also have them recheck your labs then.  Hold/do not take your spironolactone medication until follow up with your doctor/recheck of labs.   Return to ER if worse, new symptoms, fevers, increased trouble breathing, recurrent/persistent chest pain, weak/fainting, other concern.

## 2019-04-26 NOTE — ED Notes (Signed)
Pt wanting to sign out AMA, called pts living facility and informed pt refusing blood tests and wanting to sign out AMA. Director from facility spoke with pt and pt agrees to stay for labs.

## 2019-04-26 NOTE — ED Provider Notes (Addendum)
[facility] EMERGENCY DEPARTMENT Provider Note   CSN: [id number] Arrival date & time: 04/26/19  1536    History   Chief Complaint Chief Complaint  Patient presents with  . Chest Pain    HPI Emily Porter is a 63 y.o. female.     Patient c/o mid chest pain for past week. Occurs at rest. Symptoms gradual onset, constant, mild-mod,  dull, non radiating, not pleuritic. No nv, diaphoresis or sob. Denies leg pain or swelling. Occasional non prod cough. No known covid + exposure. Denies fever or chills.   The history is provided by the patient and the EMS personnel.  Chest Pain  Associated symptoms: cough   Associated symptoms: no abdominal pain, no back pain, no fever, no headache, no nausea, no palpitations, no shortness of breath and no vomiting     Past Medical History:  Diagnosis Date  . Depression   . Diabetes mellitus without complication ([place])   . HIV (human immunodeficiency virus infection) ([place])   . Hypertension   . Immune deficiency disorder ([place])    HIV  . Neuropathy   . Schizoaffective disorder ([place])   . Tobacco use   . Unsteady gait     Patient Active Problem List   Diagnosis Date Noted  . Auditory hallucinations   . Hypokalemia   . Seizure ([place])   . Suicidal ideation   . 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 HERNIA REPAIR       OB History   No obstetric history on file.      Home Medications    Prior to Admission medications   Medication Sig Start Date End Date Taking? Authorizing Provider  abacavir (ZIAGEN) 300 MG tablet Take 600 mg by mouth daily.    [provider]  albuterol (PROVENTIL HFA;VENTOLIN HFA) 108 (90 Base) MCG/ACT inhaler Inhale 2 puffs into the lungs every 6 (six) hours as needed for wheezing or shortness of breath.    [provider]  amantadine (SYMMETREL) 100 MG capsule Take 100 mg by mouth 2 (two) times daily.    [provider]  atazanavir (REYATAZ) 200 MG capsule Take 400 mg by mouth daily with breakfast.    [provider]  cetirizine (ZYRTEC) 10 MG tablet Take 10 mg by mouth daily.    [provider]  gabapentin (NEURONTIN) 100 MG capsule Take 100 mg by mouth 3 (three) times daily.    [provider]  haloperidol (HALDOL) 5 MG tablet Take 5 mg by mouth daily.    [provider]  hydrochlorothiazide (HYDRODIURIL) 25 MG tablet Take 25 mg by mouth daily.    [provider]  isoniazid (NYDRAZID) 300 MG tablet Take 300 mg by mouth daily.    [provider]  lamivudine (EPIVIR) 300 MG tablet Take 300 mg by mouth daily.    [provider]  latanoprost (XALATAN) 0.005 % ophthalmic solution Place 1 drop into both eyes at bedtime.    [provider]  lisinopril (PRINIVIL,ZESTRIL) 40 MG tablet Take 40 mg by mouth daily.    [provider]  LORazepam (ATIVAN) 1 MG tablet Take 1-2 mg by mouth See admin instructions. 1mg  daily and 2mg  at bedtime    [provider]  metoprolol succinate (TOPROL-XL) 25 MG 24 hr tablet Take 25 mg by mouth daily.    [provider]  nicotine polacrilex (NICORETTE) 4 MG gum Take 4 mg  by mouth as needed for smoking cessation.    [provider]  ondansetron (ZOFRAN) 4 MG tablet Take 4 mg by mouth every 6 (six) hours as needed for nausea or vomiting.    [provider]  pyridOXINE (VITAMIN B-6) 100 MG tablet Take 100 mg by mouth daily.    [provider]  senna (SENOKOT) 8.6 MG TABS tablet Take 1 tablet by mouth 2 (two) times daily.    [provider]  Thiamine HCl (VITAMIN B-1 PO) Take 0.5 tablets by mouth daily.    [provider]    Family History No family history on file.  Social History Social History   Tobacco Use  . Smoking status: Current Every Day Smoker    Types: Cigarettes  . Smokeless tobacco: Never Used  Substance Use Topics   . Alcohol use: No  . Drug use: Not Currently     Allergies   Penicillins   Review of Systems Review of Systems  Constitutional: Negative for chills and fever.  HENT: Negative for sore throat.   Eyes: Negative for redness.  Respiratory: Positive for cough. Negative for shortness of breath.   Cardiovascular: Positive for chest pain. Negative for palpitations and leg swelling.  Gastrointestinal: Negative for abdominal pain, nausea and vomiting.  Genitourinary: Negative for flank pain.  Musculoskeletal: Negative for back pain and neck pain.  Skin: Negative for rash.  Neurological: Negative for headaches.  Hematological: Does not bruise/bleed easily.  Psychiatric/Behavioral: Negative for confusion.     Physical Exam Updated Vital Signs BP (!) 147/77 (BP Location: Left Arm)   Pulse (!) 53   Temp 98.2 F (36.8 C) (Oral)   Resp 17   Ht 1.727 m (5\' 8" )   Wt 97.5 kg   SpO2 100%   BMI 32.69 kg/m   Physical Exam Vitals signs and nursing note reviewed.  Constitutional:      Appearance: Normal appearance. She is well-developed.  HENT:     Head: Atraumatic.     Nose: Nose normal.     Mouth/Throat:     Mouth: Mucous membranes are moist.  Eyes:     General: No scleral icterus.    Conjunctiva/sclera: Conjunctivae normal.  Neck:     Musculoskeletal: Normal range of motion and neck supple. No neck rigidity or muscular tenderness.     Trachea: No tracheal deviation.  Cardiovascular:     Rate and Rhythm: Normal rate and regular rhythm.     Pulses: Normal pulses.     Heart sounds: Normal heart sounds. No murmur. No friction rub. No gallop.   Pulmonary:     Effort: Pulmonary effort is normal. No respiratory distress.     Breath sounds: Normal breath sounds.  Abdominal:     General: Bowel sounds are normal. There is no distension.     Palpations: Abdomen is soft.     Tenderness: There is no abdominal tenderness. There is no guarding.  Genitourinary:    Comments: No cva  tenderness. Rectal, yellowish-light brown stool, heme negative (check by self).  Musculoskeletal:        General: No swelling or tenderness.     Right lower leg: No edema.     Left lower leg: No edema.  Skin:    General: Skin is warm and dry.     Findings: No rash.  Neurological:     Mental Status: She is alert.     Comments: Alert, speech normal.   Psychiatric:  Mood and Affect: Mood normal.      ED Treatments / Results  Labs (all labs ordered are listed, but only abnormal results are displayed) Results for orders placed or performed during the hospital encounter of 04/26/19  SARS Coronavirus 2 (CEPHEID - Performed in [facility] lab), [facility] Order  Result Value Ref Range   SARS Coronavirus 2 NEGATIVE NEGATIVE  Basic metabolic panel  Result Value Ref Range   Sodium 140 135 - 145 mmol/L   Potassium 5.7 (H) 3.5 - 5.1 mmol/L   Chloride 114 (H) 98 - 111 mmol/L   CO2 17 (L) 22 - 32 mmol/L   Glucose, Bld 86 70 - 99 mg/dL   BUN 44 (H) 6 - 20 mg/dL   Creatinine, Ser 2.50 (H) 0.44 - 1.00 mg/dL   Calcium 9.0 8.9 - 10.3 mg/dL   GFR calc non Af Amer 20 (L) >60 mL/min   GFR calc Af Amer 23 (L) >60 mL/min   Anion gap 9 5 - 15  CBC  Result Value Ref Range   WBC 4.1 4.0 - 10.5 K/uL   RBC 2.63 (L) 3.87 - 5.11 MIL/uL   Hemoglobin 9.2 (L) 12.0 - 15.0 g/dL   HCT 29.2 (L) 36.0 - 46.0 %   MCV 111.0 (H) 80.0 - 100.0 fL   MCH 35.0 (H) 26.0 - 34.0 pg   MCHC 31.5 30.0 - 36.0 g/dL   RDW 15.2 11.5 - 15.5 %   Platelets 175 150 - 400 K/uL   nRBC 0.0 0.0 - 0.2 %  Troponin I - ONCE - STAT  Result Value Ref Range   Troponin I 0.04 (HH) <0.03 ng/mL  Vitamin B12  Result Value Ref Range   Vitamin B-12 531 180 - 914 pg/mL  Iron and TIBC  Result Value Ref Range   Iron 36 28 - 170 ug/dL   TIBC 270 250 - 450 ug/dL   Saturation Ratios 13 10.4 - 31.8 %   UIBC 234 ug/dL  Ferritin  Result Value Ref Range   Ferritin 291 11 - 307 ng/mL  Reticulocytes  Result Value Ref Range   Retic  Ct Pct 1.8 0.4 - 3.1 %   RBC. 2.62 (L) 3.87 - 5.11 MIL/uL   Retic Count, Absolute 47.9 19.0 - 186.0 K/uL   Immature Retic Fract 10.4 2.3 - 15.9 %  Brain natriuretic peptide  Result Value Ref Range   B Natriuretic Peptide 705.0 (H) 0.0 - 100.0 pg/mL   Dg Chest 2 View  Result Date: 04/26/2019 CLINICAL DATA:  Chest pain EXAM: CHEST - 2 VIEW COMPARISON:  None. FINDINGS: The cardiac silhouette is enlarged. There are prominent interstitial lung markings bilaterally. No pneumothorax. No large pleural effusion. Fluid is seen within the fissures. No large area of consolidation. IMPRESSION: Cardiomegaly with findings suspicious for developing volume overload/pulmonary edema. An atypical infectious process can have a similar appearance. Electronically Signed   By: Constance Holster M.D.   On: 04/26/2019 17:55    EKG EKG Interpretation  Date/Time:  Wednesday Apr 26 2019 15:56:00 EDT Ventricular Rate:  54 PR Interval:    QRS Duration: 137 QT Interval:  522 QTC Calculation: 495 R Axis:   38 Text Interpretation:  Sinus rhythm Left bundle branch block Non-specific ST-t changes No previous tracing Confirmed by Lajean Saver [phone]) on 04/26/2019 4:12:08 PM   Radiology Dg Chest 2 View  Result Date: 04/26/2019 CLINICAL DATA:  Chest pain EXAM: CHEST - 2 VIEW COMPARISON:  None. FINDINGS: The cardiac  silhouette is enlarged. There are prominent interstitial lung markings bilaterally. No pneumothorax. No large pleural effusion. Fluid is seen within the fissures. No large area of consolidation. IMPRESSION: Cardiomegaly with findings suspicious for developing volume overload/pulmonary edema. An atypical infectious process can have a similar appearance. Electronically Signed   By: Constance Holster M.D.   On: 04/26/2019 17:55    Procedures Procedures (including critical care time)  Medications Ordered in ED Medications  sodium chloride flush (NS) 0.9 % injection 3 mL (has no administration in time range)      Initial Impression / Assessment and Plan / ED Course  I have reviewed the triage vital signs and the nursing notes.  Pertinent labs & imaging results that were available during my care of the patient were reviewed by me and considered in my medical decision making (see chart for details).  Iv ns. Labs. Ecg. Cxr.   Reviewed nursing notes and prior charts for additional history.   Labs reviewed by me - hgb 9, will get anemia panel, hemoccult.  k is mildly elev, cri at baseline. Iv ns bolus. d50 iv. hco3 iv. novolog iv.  Initial trop .04 - will get delta trop.   Cxr reviewed by me - no pna. ?vascular congestion. Will get bnp.   Lasix dose given in ED.   Obtained ECG from [facility] system - current ecg appears similar to ecg 01/16/19.  Recheck pt, denies chest pain, pt requests food/drink - provided.    Emily Porter was evaluated in Emergency Department on 04/26/2019 for the symptoms described in the history of present illness. She was evaluated in the context of the global COVID-19 pandemic, which necessitated consideration that the patient might be at risk for infection with the SARS-CoV-2 virus that causes COVID-19. Institutional protocols and algorithms that pertain to the evaluation of patients at risk for COVID-19 are in a state of rapid change based on information released by regulatory bodies including the CDC and federal and state organizations. These policies and algorithms were followed during the patient's care in the ED.  Recheck again, pt denies chest pain, breathing comfortably, has eaten/drank. Pt requests d/c, states she feels fine. Repeat trop is pending.   Patient refuses repeat labs/repeat troponin/k. Pt is alert, oriented. She states she feels fine, no chest pain or discomfort. No sob. Patient requests d/c from ED. Discussed risks, including risk of MI, high k, including permanent heart damage/death. Patient continues to indicate she feels fine, refuses other labs or testing, and  requests d/c.      Final Clinical Impressions(s) / ED Diagnoses   Final diagnoses:  None    ED Discharge Orders    None          Lajean Saver, MD 04/26/19 2158

## 2019-04-26 NOTE — Progress Notes (Signed)
CSW contacted Director of adult care family home, Yevonne Pax to follow up about a recent encounter involving a pt.   CSW engaged Ms. Maricela Bo in conversation regarding standard discharge practice within the ED. CSW educated Ms. Maricela Bo of what is expected of family care homes once a patient is medically cleared. Ms. Maricela Bo voiced understanding of these practices along with APS practices.    Ms. Maricela Bo reported that it is not her usual practice to drop a resident off to the ED for treatment. Ms. Maricela Bo added that she usually accompanies her residents during medical visits, but considering the COVID-19 virus, she was unable to do so during this encounter. Ms. Maricela Bo went into detail and explained that this specific patient usually is treated at [facility] and may have been uncooperative because of the unfamiliar setting.   When gently confronted about her refusing to pick up the pt from the ED, Ms. Maricela Bo expressed " I did not refuse". Ms. Maricela Bo expressed barriers to the patient getting picked up on this evening.   Ms. Maricela Bo receptive to suggestions and education from [place].

## 2019-04-26 NOTE — ED Notes (Signed)
[place], SW made aware of situation with pt and refusal from facility to pick up pt.

## 2019-04-26 NOTE — ED Notes (Signed)
This RN called the Pts facility to arrange for transportation home. The facility then transferred this RN to the facilities owner/operator Yevonne Pax. Mrs. Emily Porter was verbally abusive towards this RN over the phone and stated she was unable to provide Pt transportation. This RN spoke with Camera operator, [facility] and Social Worker regarding care for Pt. The Pt stated she was leaving and did not want further treatment, and Pt walked to waiting area for ride home. By the time Education officer, museum arrived, the Pt had already been picked up by her facility [facility].

## 2019-07-17 ENCOUNTER — Encounter: Payer: Self-pay | Admitting: Internal Medicine

## 2019-08-23 ENCOUNTER — Encounter: Payer: Self-pay | Admitting: Gastroenterology

## 2019-08-23 ENCOUNTER — Ambulatory Visit (INDEPENDENT_AMBULATORY_CARE_PROVIDER_SITE_OTHER): Payer: Medicaid Other | Admitting: Gastroenterology

## 2019-08-23 ENCOUNTER — Other Ambulatory Visit: Payer: Self-pay

## 2019-08-23 VITALS — BP 140/86 | HR 57 | Temp 97.0°F | Ht 67.0 in | Wt 192.8 lb

## 2019-08-23 DIAGNOSIS — R7989 Other specified abnormal findings of blood chemistry: Secondary | ICD-10-CM | POA: Insufficient documentation

## 2019-08-23 DIAGNOSIS — R945 Abnormal results of liver function studies: Secondary | ICD-10-CM | POA: Diagnosis not present

## 2019-08-23 DIAGNOSIS — D539 Nutritional anemia, unspecified: Secondary | ICD-10-CM | POA: Diagnosis not present

## 2019-08-23 NOTE — Patient Instructions (Signed)
Please have labs done.  Further recommendations to follow labs.

## 2019-08-23 NOTE — Progress Notes (Signed)
Primary Care Physician:  Monico Blitz, MD  Primary Gastroenterologist:  Garfield Cornea, MD   Chief Complaint  Patient presents with  . Anemia    HFU from [facility]    HPI:  Emily Porter is a 63 y.o. female with history of HIV, schizophrenia, diabetes, CHF here at the request of Dr. Manuella Ghazi for further evaluation of anemia.  Patient presents with someone from group home, transporter who is not very familiar with patient.  Patient unable to provide significant history.  Patient was hospitalized July 30 through July 22, 2019 for inability to walk.  She cannot be handled at the group home because of her inability to ambulate.  Patient was hospitalized July 20 for chest pain, shortness of breath.  She was noted to have a hemoglobin of 6.9, creatinine 2.31, BUN 43.  She was given 2 units of packed red blood cells.  Stool was noted to be heme-negative.  Other pertinent labs include BNP 27,325, AST 68.8, ALT 215, total bilirubin 1, alkaline phosphatase 122, albumin 3, white blood cell count 6000, MCV 111.7, platelets 224,000.  Posttransfusion her hemoglobin was up to 8.5.  Blood cultures grew coag negative staph and Staphylococcus capitis.Chest x-ray showed CHF.  According to epic she has had approximately 20 pound weight loss since May.  She denies any weight loss when I asked her.  She states her appetite's okay.  According to group home transporter, there is been no mention of any appetite concerns or eating issues.  Patient states her bowel movements are regular.  She denies vomiting but states she feels nauseated at times.  There is been no report of melena or rectal bleeding.  She denies abdominal pain.  Current Outpatient Medications  Medication Sig Dispense Refill  . abacavir (ZIAGEN) 300 MG tablet Take 600 mg by mouth daily.    Marland Kitchen albuterol (PROVENTIL HFA;VENTOLIN HFA) 108 (90 Base) MCG/ACT inhaler Inhale 2 puffs into the lungs every 6 (six) hours as needed for wheezing or shortness of breath.    Marland Kitchen  amantadine (SYMMETREL) 100 MG capsule Take 100 mg by mouth 2 (two) times daily.    Marland Kitchen aspirin EC 81 MG tablet Take 81 mg by mouth daily.    Marland Kitchen atazanavir (REYATAZ) 200 MG capsule Take 400 mg by mouth daily with breakfast.    . diclofenac sodium (VOLTAREN) 1 % GEL Apply 2 g topically 4 (four) times daily.    . furosemide (LASIX) 20 MG tablet Take 20 mg by mouth daily.    Marland Kitchen gabapentin (NEURONTIN) 100 MG capsule Take 100 mg by mouth 3 (three) times daily.    . haloperidol (HALDOL) 5 MG tablet Take 5 mg by mouth at bedtime.     Marland Kitchen isoniazid (NYDRAZID) 300 MG tablet Take 300 mg by mouth daily.    Marland Kitchen lamivudine (EPIVIR) 300 MG tablet Take 300 mg by mouth daily.    Marland Kitchen latanoprost (XALATAN) 0.005 % ophthalmic solution Place 1 drop into both eyes at bedtime.    . Lidocaine, Anorectal, (RECTASMOOTHE) 5 % CREA Apply 1 application topically 2 (two) times a day.    Marland Kitchen LORazepam (ATIVAN) 1 MG tablet Take 1-2 mg by mouth See admin instructions. 1mg  daily and 2mg  at bedtime    . metoprolol succinate (TOPROL-XL) 50 MG 24 hr tablet Take 50 mg by mouth daily.     . ondansetron (ZOFRAN) 4 MG tablet Take 4 mg by mouth every 6 (six) hours as needed for nausea or vomiting.    . pyridOXINE (  VITAMIN B-6) 50 MG tablet Take 100 mg by mouth daily.     Marland Kitchen senna (SENOKOT) 8.6 MG TABS tablet Take 1 tablet by mouth 2 (two) times daily.    Marland Kitchen spironolactone (ALDACTONE) 25 MG tablet Take 12.5 mg by mouth daily.     No current facility-administered medications for this visit.     Allergies as of 08/23/2019 - Review Complete 08/23/2019  Allergen Reaction Noted  . Penicillins Hives 07/13/2016    Past Medical History:  Diagnosis Date  . Depression   . Diabetes mellitus without complication ([place])   . HIV (human immunodeficiency virus infection) ([place])   . Hypertension   . Immune deficiency disorder ([place])    HIV  . Neuropathy   . Schizoaffective disorder ([place])   . Tobacco use   . Unsteady gait     Past Surgical History:   Procedure Laterality Date  . HERNIA REPAIR      History reviewed. No pertinent family history.  Patient unable to tell me.  Social History   Socioeconomic History  . Marital status: Single    Spouse name: Not on file  . Number of children: Not on file  . Years of education: Not on file  . Highest education level: Not on file  Occupational History  . Not on file  Social Needs  . Financial resource strain: Not on file  . Food insecurity    Worry: Not on file    Inability: Not on file  . Transportation needs    Medical: Not on file    Non-medical: Not on file  Tobacco Use  . Smoking status: Former Smoker    Types: Cigarettes  . Smokeless tobacco: Never Used  Substance and Sexual Activity  . Alcohol use: No  . Drug use: Not Currently  . Sexual activity: Not on file  Lifestyle  . Physical activity    Days per week: Not on file    Minutes per session: Not on file  . Stress: Not on file  Relationships  . Social Herbalist on phone: Not on file    Gets together: Not on file    Attends religious service: Not on file    Active member of club or organization: Not on file    Attends meetings of clubs or organizations: Not on file    Relationship status: Not on file  . Intimate partner violence    Fear of current or ex partner: Not on file    Emotionally abused: Not on file    Physically abused: Not on file    Forced sexual activity: Not on file  Other Topics Concern  . Not on file  Social History Narrative  . Not on file      ROS: Question reliability  General: Negative for anorexia,  ever, chills, fatigue, weakness.  See HPI Eyes: Negative for vision changes.  ENT: Negative for hoarseness, difficulty swallowing , nasal congestion. CV: Negative for chest pain, angina, palpitations, dyspnea on exertion, peripheral edema.  Respiratory: Negative for dyspnea at rest, dyspnea on exertion, cough, sputum, wheezing.  GI: See history of present illness. GU:   Negative for dysuria, hematuria, urinary incontinence, urinary frequency, nocturnal urination.  MS: Negative for joint pain, low back pain.  Derm: Negative for rash or itching.  Neuro: Negative for weakness, abnormal sensation, seizure, frequent headaches, memory loss, confusion.  Psych: Negative for anxiety, depression, suicidal ideation, hallucinations.  Endo: Negative for unusual weight change.  Heme: Negative for  bruising or bleeding. Allergy: Negative for rash or hives.    Physical Examination:  BP 140/86   Pulse (!) 57   Temp (!) 97 F (36.1 C) (Oral)   Ht 5\' 7"  (1.702 m)   Wt 192 lb 12.8 oz (87.5 kg)   LMP  (LMP Unknown)   BMI 30.20 kg/m    General: Well-nourished, well-developed in no acute distress.  Head: Normocephalic, atraumatic.   Eyes: Conjunctiva pink, no icterus. Mouth: Oropharyngeal mucosa moist and pink , no lesions erythema or exudate. Neck: Supple without thyromegaly, masses, or lymphadenopathy.  Lungs: Clear to auscultation bilaterally.  Heart: Regular rate and rhythm, no murmurs rubs or gallops.  Abdomen: Bowel sounds are normal, nontender, nondistended, no hepatosplenomegaly or masses, no abdominal bruits or    hernia , no rebound or guarding.   Rectal: Not performed Extremities: No lower extremity edema. No clubbing or deformities.  Neuro: Alert and oriented x 4 , grossly normal neurologically.  Skin: Warm and dry, no rash or jaundice.   Psych: Alert and cooperative, normal mood and affect.  Labs: Lab Results  Component Value Date   CREATININE 2.50 (H) 04/26/2019   BUN 44 (H) 04/26/2019   NA 140 04/26/2019   K 5.7 (H) 04/26/2019   CL 114 (H) 04/26/2019   CO2 17 (L) 04/26/2019   Lab Results  Component Value Date   WBC 4.1 04/26/2019   HGB 9.2 (L) 04/26/2019   HCT 29.2 (L) 04/26/2019   MCV 111.0 (H) 04/26/2019   PLT 175 04/26/2019   Lab Results  Component Value Date   IRON 36 04/26/2019   TIBC 270 04/26/2019   FERRITIN 291 04/26/2019    Lab Results  Component Value Date   ALT 27 05/28/2017   AST 27 05/28/2017   ALKPHOS 103 05/28/2017   BILITOT 0.9 05/28/2017   Lab Results  Component Value Date   [id number] 04/26/2019   Lab Results  Component Value Date   FOLATE 9.9 04/26/2019     Imaging Studies: No results found.

## 2019-08-24 ENCOUNTER — Other Ambulatory Visit: Payer: Self-pay

## 2019-08-24 DIAGNOSIS — N289 Disorder of kidney and ureter, unspecified: Secondary | ICD-10-CM

## 2019-08-24 LAB — CBC WITH DIFFERENTIAL/PLATELET
Absolute Monocytes: 696 cells/uL (ref 200–950)
Basophils Absolute: 9 cells/uL (ref 0–200)
Basophils Relative: 0.2 %
Eosinophils Absolute: 108 cells/uL (ref 15–500)
Eosinophils Relative: 2.3 %
HCT: 30.7 % — ABNORMAL LOW (ref 35.0–45.0)
Hemoglobin: 9.9 g/dL — ABNORMAL LOW (ref 11.7–15.5)
Lymphs Abs: 1100 cells/uL (ref 850–3900)
MCH: 31.9 pg (ref 27.0–33.0)
MCHC: 32.2 g/dL (ref 32.0–36.0)
MCV: 99 fL (ref 80.0–100.0)
MPV: 11 fL (ref 7.5–12.5)
Monocytes Relative: 14.8 %
Neutro Abs: 2787 cells/uL (ref 1500–7800)
Neutrophils Relative %: 59.3 %
Platelets: 195 10*3/uL (ref 140–400)
RBC: 3.1 10*6/uL — ABNORMAL LOW (ref 3.80–5.10)
RDW: 17.7 % — ABNORMAL HIGH (ref 11.0–15.0)
Total Lymphocyte: 23.4 %
WBC: 4.7 10*3/uL (ref 3.8–10.8)

## 2019-08-24 LAB — IRON,TIBC AND FERRITIN PANEL
%SAT: 13 % (calc) — ABNORMAL LOW (ref 16–45)
Ferritin: 197 ng/mL (ref 16–232)
Iron: 39 ug/dL — ABNORMAL LOW (ref 45–160)
TIBC: 296 mcg/dL (calc) (ref 250–450)

## 2019-08-24 LAB — COMPREHENSIVE METABOLIC PANEL
AG Ratio: 0.7 (calc) — ABNORMAL LOW (ref 1.0–2.5)
ALT: 18 U/L (ref 6–29)
AST: 23 U/L (ref 10–35)
Albumin: 3.4 g/dL — ABNORMAL LOW (ref 3.6–5.1)
Alkaline phosphatase (APISO): 97 U/L (ref 37–153)
BUN/Creatinine Ratio: 19 (calc) (ref 6–22)
BUN: 52 mg/dL — ABNORMAL HIGH (ref 7–25)
CO2: 19 mmol/L — ABNORMAL LOW (ref 20–32)
Calcium: 9.3 mg/dL (ref 8.6–10.4)
Chloride: 111 mmol/L — ABNORMAL HIGH (ref 98–110)
Creat: 2.7 mg/dL — ABNORMAL HIGH (ref 0.50–0.99)
Globulin: 4.9 g/dL (calc) — ABNORMAL HIGH (ref 1.9–3.7)
Glucose, Bld: 97 mg/dL (ref 65–139)
Potassium: 5.7 mmol/L — ABNORMAL HIGH (ref 3.5–5.3)
Sodium: 139 mmol/L (ref 135–146)
Total Bilirubin: 1.5 mg/dL — ABNORMAL HIGH (ref 0.2–1.2)
Total Protein: 8.3 g/dL — ABNORMAL HIGH (ref 6.1–8.1)

## 2019-08-24 LAB — B12 AND FOLATE PANEL
Folate: 9 ng/mL
Vitamin B-12: 1208 pg/mL — ABNORMAL HIGH (ref 200–1100)

## 2019-08-24 NOTE — Assessment & Plan Note (Signed)
Elevated recently while she was inpatient at [facility].  Possibly due to hepatic congestion in setting of CHF.  Recheck labs.

## 2019-08-24 NOTE — Assessment & Plan Note (Signed)
62 year old female presenting for further evaluation of macrocytic anemia.  Required blood transfusion during recent hospitalization.  Heme-negative stool.  Patient is a poor historian.  She denies any GI complaints.  No blood in the stool or melena.  Will obtain labs to follow-up on anemia, check B12/folate/iron.  Discussed with patient today that likely would offer her a colonoscopy with possible upper endoscopy, she states she is willing if that is the recommendations.  Was told by group home staff today that she typically sounds her own consents need to confirm that.

## 2019-09-20 ENCOUNTER — Telehealth: Payer: Self-pay

## 2019-09-20 NOTE — Telephone Encounter (Signed)
Tried to call Dr. Befekadu's office to f/u on referral, LMOVM. 

## 2019-10-27 ENCOUNTER — Telehealth: Payer: Self-pay | Admitting: Gastroenterology

## 2019-10-27 NOTE — Telephone Encounter (Signed)
Notification that patient did not show up for her nephrology consult appointment.  Please contact group home and let them know that they need to reschedule that appointment as it is very important because her kidney function has declined.

## 2019-10-30 NOTE — Telephone Encounter (Signed)
No further recommendations.

## 2019-10-30 NOTE — Telephone Encounter (Signed)
Called group home and spoke to [place]. She is aware pt missed nephrology appt d/t pt was admitted to [facility]. Pt is still hospitalized. Velda Shell phone number to [facility].

## 2019-12-22 DEATH — deceased
